# Patient Record
Sex: Male | Born: 1965 | Race: Black or African American | Hispanic: No | Marital: Married | Smoking: Never smoker
Health system: Southern US, Community
[De-identification: ages and names within clinical notes are randomized; demographics above are authoritative.]

## PROBLEM LIST (undated history)

## (undated) ENCOUNTER — Emergency Department (HOSPITAL_COMMUNITY): Admission: EM | Payer: No Typology Code available for payment source

## (undated) DIAGNOSIS — Z5900 Homelessness unspecified: Secondary | ICD-10-CM

## (undated) DIAGNOSIS — F1020 Alcohol dependence, uncomplicated: Secondary | ICD-10-CM

## (undated) DIAGNOSIS — G8929 Other chronic pain: Secondary | ICD-10-CM

---

## 1999-12-15 ENCOUNTER — Emergency Department (HOSPITAL_COMMUNITY): Admission: EM | Admit: 1999-12-15 | Discharge: 1999-12-15 | Payer: Self-pay | Admitting: Emergency Medicine

## 2019-07-12 ENCOUNTER — Emergency Department (HOSPITAL_COMMUNITY): Payer: Self-pay

## 2019-07-12 ENCOUNTER — Emergency Department (HOSPITAL_COMMUNITY)
Admission: EM | Admit: 2019-07-12 | Discharge: 2019-07-12 | Disposition: A | Discharge status: Home / Self Care | Payer: Self-pay | Attending: Emergency Medicine | Admitting: Emergency Medicine

## 2019-07-12 ENCOUNTER — Encounter (HOSPITAL_COMMUNITY): Payer: Self-pay

## 2019-07-12 DIAGNOSIS — S0101XA Laceration without foreign body of scalp, initial encounter: Secondary | ICD-10-CM | POA: Insufficient documentation

## 2019-07-12 DIAGNOSIS — Y939 Activity, unspecified: Secondary | ICD-10-CM | POA: Insufficient documentation

## 2019-07-12 DIAGNOSIS — S0990XA Unspecified injury of head, initial encounter: Secondary | ICD-10-CM

## 2019-07-12 DIAGNOSIS — Y999 Unspecified external cause status: Secondary | ICD-10-CM | POA: Insufficient documentation

## 2019-07-12 DIAGNOSIS — Y929 Unspecified place or not applicable: Secondary | ICD-10-CM | POA: Insufficient documentation

## 2019-07-12 DIAGNOSIS — S060X0A Concussion without loss of consciousness, initial encounter: Secondary | ICD-10-CM | POA: Insufficient documentation

## 2019-07-12 LAB — PROTIME-INR
INR: 1 (ref 0.8–1.2)
Prothrombin Time: 12.9 seconds (ref 11.4–15.2)

## 2019-07-12 LAB — I-STAT CHEM 8, ED
BUN: 8 mg/dL (ref 6–20)
Calcium, Ion: 1.12 mmol/L — ABNORMAL LOW (ref 1.15–1.40)
Chloride: 96 mmol/L — ABNORMAL LOW (ref 98–111)
Creatinine, Ser: 1.1 mg/dL (ref 0.61–1.24)
Glucose, Bld: 103 mg/dL — ABNORMAL HIGH (ref 70–99)
HCT: 31 % — ABNORMAL LOW (ref 39.0–52.0)
Hemoglobin: 10.5 g/dL — ABNORMAL LOW (ref 13.0–17.0)
Potassium: 3.5 mmol/L (ref 3.5–5.1)
Sodium: 133 mmol/L — ABNORMAL LOW (ref 135–145)
TCO2: 25 mmol/L (ref 22–32)

## 2019-07-12 LAB — COMPREHENSIVE METABOLIC PANEL
ALT: 72 U/L — ABNORMAL HIGH (ref 0–44)
AST: 121 U/L — ABNORMAL HIGH (ref 15–41)
Albumin: 3.6 g/dL (ref 3.5–5.0)
Alkaline Phosphatase: 59 U/L (ref 38–126)
Anion gap: 12 (ref 5–15)
BUN: 7 mg/dL (ref 6–20)
CO2: 20 mmol/L — ABNORMAL LOW (ref 22–32)
Calcium: 8.3 mg/dL — ABNORMAL LOW (ref 8.9–10.3)
Chloride: 100 mmol/L (ref 98–111)
Creatinine, Ser: 0.85 mg/dL (ref 0.61–1.24)
GFR calc Af Amer: >60 mL/min (ref >60)
GFR calc non Af Amer: >60 mL/min (ref >60)
Glucose, Bld: 105 mg/dL — ABNORMAL HIGH (ref 70–99)
Potassium: 3.1 mmol/L — ABNORMAL LOW (ref 3.5–5.1)
Sodium: 132 mmol/L — ABNORMAL LOW (ref 135–145)
Total Bilirubin: 0.6 mg/dL (ref 0.3–1.2)
Total Protein: 6.5 g/dL (ref 6.5–8.1)

## 2019-07-12 LAB — ETHANOL: Alcohol, Ethyl (B): 284 mg/dL — ABNORMAL HIGH (ref <10)

## 2019-07-12 LAB — URINALYSIS, ROUTINE W REFLEX MICROSCOPIC
Bacteria, UA: NONE SEEN
Bilirubin Urine: NEGATIVE
Glucose, UA: NEGATIVE mg/dL
Ketones, ur: NEGATIVE mg/dL
Leukocytes,Ua: NEGATIVE
Nitrite: NEGATIVE
Protein, ur: NEGATIVE mg/dL
Specific Gravity, Urine: 1.002 — ABNORMAL LOW (ref 1.005–1.030)
pH: 6 (ref 5.0–8.0)

## 2019-07-12 LAB — CBC
HCT: 29.2 % — ABNORMAL LOW (ref 39.0–52.0)
Hemoglobin: 9.9 g/dL — ABNORMAL LOW (ref 13.0–17.0)
MCH: 35.5 pg — ABNORMAL HIGH (ref 26.0–34.0)
MCHC: 33.9 g/dL (ref 30.0–36.0)
MCV: 104.7 fL — ABNORMAL HIGH (ref 80.0–100.0)
Platelets: 125 10*3/uL — ABNORMAL LOW (ref 150–400)
RBC: 2.79 MIL/uL — ABNORMAL LOW (ref 4.22–5.81)
RDW: 12.4 % (ref 11.5–15.5)
WBC: 4.7 10*3/uL (ref 4.0–10.5)
nRBC: 0 % (ref 0.0–0.2)

## 2019-07-12 LAB — SAMPLE TO BLOOD BANK

## 2019-07-12 MED ORDER — KETAMINE HCL 50 MG/5ML IJ SOSY
PREFILLED_SYRINGE | INTRAMUSCULAR | Status: AC
  Filled 2019-07-12: qty 10

## 2019-07-12 MED ORDER — LACTATED RINGERS IV BOLUS
1000.0000 mL | Freq: Once | INTRAVENOUS | Status: AC
  Administered 2019-07-12: 1000 mL via INTRAVENOUS

## 2019-07-12 NOTE — ED Notes (Signed)
Patient transported to CT 
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
## 2019-07-12 NOTE — ED Notes (Signed)
Pt voided unknown amount on bed
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
## 2019-07-29 ENCOUNTER — Emergency Department (HOSPITAL_COMMUNITY)
Admission: EM | Admit: 2019-07-29 | Discharge: 2019-07-29 | Disposition: A | Discharge status: Home / Self Care | Payer: Self-pay | Attending: Emergency Medicine | Admitting: Emergency Medicine

## 2019-07-29 DIAGNOSIS — Z4802 Encounter for removal of sutures: Secondary | ICD-10-CM | POA: Insufficient documentation

## 2019-07-29 MED ORDER — BACITRACIN-NEOMYCIN-POLYMYXIN 400-5-5000 EX OINT
TOPICAL_OINTMENT | Freq: Every day | CUTANEOUS | Status: DC
  Filled 2019-07-29: qty 1

## 2019-07-29 MED ORDER — LIDOCAINE-EPINEPHRINE-TETRACAINE (LET) TOPICAL GEL
3.0000 mL | Freq: Once | TOPICAL | Status: AC
  Administered 2019-07-29: 3 mL via TOPICAL
  Filled 2019-07-29: qty 3

## 2019-07-29 NOTE — ED Notes (Signed)
All appropriate discharge materials reviewed at length with patient. Time for questions provided. Pt has no other questions at this time and verbalizes understanding of all provided materials.  

## 2019-07-29 NOTE — ED Notes (Signed)
Neosporin and bandages placed on pts suture sites.
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
## 2019-07-29 NOTE — Discharge Instructions (Signed)
Put Neosporin over your wounds.

## 2019-07-29 NOTE — ED Triage Notes (Signed)
Pt here to have staples removed

## 2020-09-29 ENCOUNTER — Other Ambulatory Visit: Payer: Self-pay

## 2020-09-29 ENCOUNTER — Observation Stay (HOSPITAL_COMMUNITY)
Admission: EM | Admit: 2020-09-29 | Discharge: 2020-09-30 | Disposition: A | Discharge status: Home / Self Care | Payer: Self-pay | Attending: Emergency Medicine | Admitting: Emergency Medicine

## 2020-09-29 ENCOUNTER — Observation Stay (HOSPITAL_COMMUNITY): Payer: Self-pay

## 2020-09-29 ENCOUNTER — Emergency Department (HOSPITAL_COMMUNITY): Payer: Self-pay

## 2020-09-29 DIAGNOSIS — D649 Anemia, unspecified: Secondary | ICD-10-CM | POA: Insufficient documentation

## 2020-09-29 DIAGNOSIS — Z20822 Contact with and (suspected) exposure to covid-19: Secondary | ICD-10-CM | POA: Insufficient documentation

## 2020-09-29 DIAGNOSIS — E871 Hypo-osmolality and hyponatremia: Secondary | ICD-10-CM | POA: Insufficient documentation

## 2020-09-29 DIAGNOSIS — R188 Other ascites: Secondary | ICD-10-CM

## 2020-09-29 DIAGNOSIS — K863 Pseudocyst of pancreas: Secondary | ICD-10-CM | POA: Insufficient documentation

## 2020-09-29 DIAGNOSIS — K859 Acute pancreatitis without necrosis or infection, unspecified: Principal | ICD-10-CM | POA: Insufficient documentation

## 2020-09-29 LAB — CBC
HCT: 36.5 % — ABNORMAL LOW (ref 39.0–52.0)
Hemoglobin: 12.5 g/dL — ABNORMAL LOW (ref 13.0–17.0)
MCH: 35.9 pg — ABNORMAL HIGH (ref 26.0–34.0)
MCHC: 34.2 g/dL (ref 30.0–36.0)
MCV: 104.9 fL — ABNORMAL HIGH (ref 80.0–100.0)
Platelets: 141 10*3/uL — ABNORMAL LOW (ref 150–400)
RBC: 3.48 MIL/uL — ABNORMAL LOW (ref 4.22–5.81)
RDW: 12.4 % (ref 11.5–15.5)
WBC: 6.4 10*3/uL (ref 4.0–10.5)
nRBC: 0 % (ref 0.0–0.2)

## 2020-09-29 LAB — URINALYSIS, ROUTINE W REFLEX MICROSCOPIC
Bacteria, UA: NONE SEEN
Bilirubin Urine: NEGATIVE
Glucose, UA: NEGATIVE mg/dL
Hgb urine dipstick: NEGATIVE
Ketones, ur: NEGATIVE mg/dL
Nitrite: NEGATIVE
Protein, ur: 30 mg/dL — AB
Specific Gravity, Urine: 1.023 (ref 1.005–1.030)
pH: 5 (ref 5.0–8.0)

## 2020-09-29 LAB — BASIC METABOLIC PANEL
Anion gap: 9 (ref 5–15)
BUN: <5 mg/dL — ABNORMAL LOW (ref 6–20)
CO2: 24 mmol/L (ref 22–32)
Calcium: 9.1 mg/dL (ref 8.9–10.3)
Chloride: 99 mmol/L (ref 98–111)
Creatinine, Ser: 0.72 mg/dL (ref 0.61–1.24)
GFR, Estimated: >60 mL/min (ref >60)
Glucose, Bld: 95 mg/dL (ref 70–99)
Potassium: 4 mmol/L (ref 3.5–5.1)
Sodium: 132 mmol/L — ABNORMAL LOW (ref 135–145)

## 2020-09-29 LAB — COMPREHENSIVE METABOLIC PANEL
ALT: 36 U/L (ref 0–44)
AST: 36 U/L (ref 15–41)
Albumin: 3.6 g/dL (ref 3.5–5.0)
Alkaline Phosphatase: 78 U/L (ref 38–126)
Anion gap: 10 (ref 5–15)
BUN: <5 mg/dL — ABNORMAL LOW (ref 6–20)
CO2: 25 mmol/L (ref 22–32)
Calcium: 9.5 mg/dL (ref 8.9–10.3)
Chloride: 93 mmol/L — ABNORMAL LOW (ref 98–111)
Creatinine, Ser: 0.72 mg/dL (ref 0.61–1.24)
GFR, Estimated: >60 mL/min (ref >60)
Glucose, Bld: 94 mg/dL (ref 70–99)
Potassium: 3.9 mmol/L (ref 3.5–5.1)
Sodium: 128 mmol/L — ABNORMAL LOW (ref 135–145)
Total Bilirubin: 1 mg/dL (ref 0.3–1.2)
Total Protein: 7.4 g/dL (ref 6.5–8.1)

## 2020-09-29 LAB — LIPASE, BLOOD: Lipase: 315 U/L — ABNORMAL HIGH (ref 11–51)

## 2020-09-29 LAB — OSMOLALITY, URINE: Osmolality, Ur: 388 mOsm/kg (ref 300–900)

## 2020-09-29 LAB — RESP PANEL BY RT-PCR (FLU A&B, COVID) ARPGX2
Influenza A by PCR: NEGATIVE
Influenza B by PCR: NEGATIVE
SARS Coronavirus 2 by RT PCR: NEGATIVE

## 2020-09-29 LAB — SODIUM, URINE, RANDOM: Sodium, Ur: 33 mmol/L

## 2020-09-29 MED ORDER — RIVAROXABAN 10 MG PO TABS
10.0000 mg | ORAL_TABLET | Freq: Every day | ORAL | Status: DC
  Filled 2020-09-29 (×2): qty 1

## 2020-09-29 MED ORDER — SODIUM CHLORIDE 0.9 % IV SOLN: INTRAVENOUS | Status: DC

## 2020-09-29 MED ORDER — SODIUM CHLORIDE 0.9 % IV BOLUS
1000.0000 mL | Freq: Once | INTRAVENOUS | Status: AC
  Administered 2020-09-29: 1000 mL via INTRAVENOUS

## 2020-09-29 MED ORDER — SODIUM CHLORIDE 0.9 % IV SOLN: INTRAVENOUS | Status: AC

## 2020-09-29 MED ORDER — NICOTINE 7 MG/24HR TD PT24
7.0000 mg | MEDICATED_PATCH | Freq: Every day | TRANSDERMAL | Status: DC
  Filled 2020-09-29 (×2): qty 1

## 2020-09-29 MED ORDER — ONDANSETRON HCL 4 MG/2ML IJ SOLN
4.0000 mg | Freq: Three times a day (TID) | INTRAMUSCULAR | Status: DC | PRN
  Administered 2020-09-29: 4 mg via INTRAVENOUS
  Filled 2020-09-29: qty 2

## 2020-09-29 MED ORDER — THIAMINE HCL 100 MG PO TABS
100.0000 mg | ORAL_TABLET | Freq: Every day | ORAL | Status: DC
  Filled 2020-09-29: qty 1

## 2020-09-29 MED ORDER — ONDANSETRON HCL 4 MG/2ML IJ SOLN
4.0000 mg | Freq: Once | INTRAMUSCULAR | Status: AC
  Administered 2020-09-29: 4 mg via INTRAVENOUS
  Filled 2020-09-29: qty 2

## 2020-09-29 MED ORDER — HYDROMORPHONE HCL 1 MG/ML IJ SOLN
0.5000 mg | INTRAMUSCULAR | Status: DC | PRN
Start: 2020-09-29 — End: 2020-09-30
  Administered 2020-09-29: 0.5 mg via INTRAVENOUS
  Filled 2020-09-29: qty 1

## 2020-09-29 MED ORDER — IOHEXOL 350 MG/ML SOLN
80.0000 mL | Freq: Once | INTRAVENOUS | Status: AC | PRN
  Administered 2020-09-29: 80 mL via INTRAVENOUS

## 2020-09-29 MED ORDER — HYDROMORPHONE HCL 1 MG/ML IJ SOLN
0.5000 mg | Freq: Once | INTRAMUSCULAR | Status: AC
  Administered 2020-09-29: 0.5 mg via INTRAVENOUS
  Filled 2020-09-29: qty 1

## 2020-09-29 MED ORDER — SODIUM CHLORIDE 0.9% FLUSH: 3.0000 mL | Freq: Two times a day (BID) | INTRAVENOUS | Status: DC

## 2020-09-29 NOTE — Progress Notes (Signed)
Patient ate lunch at 1300 and aware to be NPO for 4 hour for U/S abdomen. Her verbalized not to eat or drink anything until test completed.

## 2020-09-29 NOTE — ED Provider Notes (Signed)
[facility] EMERGENCY DEPARTMENT Provider Note   CSN: [id number] Arrival date & time: 09/29/20  8469     History Chief Complaint  Patient presents with   Abdominal Pain    Nathaniel Hickman is a 55 y.o. male.  Patient with no past surgical history presents the emergency department for evaluation of sharp, upper abdominal pain with radiation to the back over the past 2 to 3 weeks.  Pain has been occurring every day.  He has associated nausea and occasional vomiting.  No bowel or urine changes.  No chest pain or shortness of breath.  He has never had pain like this in the past.  He states that he has never been in the hospital before.  He drinks a 40 ounce beer "every other day".  He also has been taking ibuprofen, Aleve, Goody powder, Maalox for his symptoms without improvement.  Transported by EMS early this morning.      No past medical history on file.  There are no problems to display for this patient.   No past surgical history on file.     No family history on file.     Home Medications Prior to Admission medications   Not on File    Allergies    Patient has no known allergies.  Review of Systems   Review of Systems  Constitutional:  Negative for fever.  HENT:  Negative for rhinorrhea and sore throat.   Eyes:  Negative for redness.  Respiratory:  Negative for cough.   Cardiovascular:  Negative for chest pain.  Gastrointestinal:  Positive for abdominal pain, nausea and vomiting. Negative for diarrhea.  Genitourinary:  Negative for dysuria and hematuria.  Musculoskeletal:  Positive for back pain. Negative for myalgias.  Skin:  Negative for rash.  Neurological:  Negative for headaches.   Physical Exam Updated Vital Signs BP (!) 120/97 (BP Location: Left Arm)   Pulse 79   Temp 97.7 F (36.5 C) (Oral)   Resp 20   Ht 5\' 5"  (1.651 m)   Wt 60 kg   SpO2 100%   BMI 22.01 kg/m   Physical Exam Vitals and nursing note reviewed.   Constitutional:      General: He is in acute distress.     Appearance: He is well-developed.     Comments: Patient appears uncomfortable, in pain  HENT:     Head: Normocephalic and atraumatic.  Eyes:     General:        Right eye: No discharge.        Left eye: No discharge.     Conjunctiva/sclera: Conjunctivae normal.  Cardiovascular:     Rate and Rhythm: Normal rate and regular rhythm.     Heart sounds: Normal heart sounds.  Pulmonary:     Effort: Pulmonary effort is normal.     Breath sounds: Normal breath sounds.  Abdominal:     Palpations: Abdomen is soft.     Tenderness: There is generalized abdominal tenderness (Moderate generalized abdominal pain without rebound or guarding, worse in the upper quadrants).  Musculoskeletal:     Cervical back: Normal range of motion and neck supple.  Skin:    General: Skin is warm and dry.  Neurological:     Mental Status: He is alert.    ED Results / Procedures / Treatments   Labs (all labs ordered are listed, but only abnormal results are displayed) Labs Reviewed  LIPASE, BLOOD - Abnormal; Notable for the following components:  Result Value   Lipase 315 (*)    All other components within normal limits  COMPREHENSIVE METABOLIC PANEL - Abnormal; Notable for the following components:   Sodium 128 (*)    Chloride 93 (*)    BUN <5 (*)    All other components within normal limits  CBC - Abnormal; Notable for the following components:   RBC 3.48 (*)    Hemoglobin 12.5 (*)    HCT 36.5 (*)    MCV 104.9 (*)    MCH 35.9 (*)    Platelets 141 (*)    All other components within normal limits  URINALYSIS, ROUTINE W REFLEX MICROSCOPIC - Abnormal; Notable for the following components:   Color, Urine AMBER (*)    APPearance HAZY (*)    Protein, ur 30 (*)    Leukocytes,Ua TRACE (*)    All other components within normal limits  RESP PANEL BY RT-PCR (FLU A&B, COVID) ARPGX2    EKG None  Radiology CT ABDOMEN PELVIS W  CONTRAST  Result Date: 09/29/2020 CLINICAL DATA:  Abdominal pain, question pancreatitis EXAM: CT ABDOMEN AND PELVIS WITH CONTRAST TECHNIQUE: Multidetector CT imaging of the abdomen and pelvis was performed using the standard protocol following bolus administration of intravenous contrast. CONTRAST:  58mL OMNIPAQUE IOHEXOL 350 MG/ML SOLN COMPARISON:  None. FINDINGS: Lower chest: There is dependent subsegmental atelectasis in the lung bases. The imaged heart is unremarkable. Hepatobiliary: The liver is diffusely hypoattenuating consistent with fatty infiltration. There are no focal lesions. The gallbladder is unremarkable. There is no biliary ductal dilatation. Pancreas: There is stranding in the peripancreatic fat predominantly around the body/tail. There is no main pancreatic ductal dilatation. Spleen: There is a 6 mm enhancing lesion in the superior portion of the spleen. The spleen is otherwise unremarkable. Adrenals/Urinary Tract: The adrenals are unremarkable. A subcentimeter lesion in the left kidney lower pole is too small to characterize but likely reflects a cyst. The kidneys are otherwise normal, with no other focal lesion, stone, hydronephrosis, or hydroureter. The bladder is unremarkable. Stomach/Bowel: There is a 5.3 cm AP by 2.0 cm TV by 3.4 cm cc peripherally enhancing collection along the greater curvature of the stomach. The stomach wall appears somewhat thickened, though this is likely due to underdistention. There is no evidence of bowel obstruction. There is mild fluid distention of multiple loops of small and large bowel. The appendix is normal. Vascular/Lymphatic: The abdominal aorta is nonaneurysmal. The main portal and splenic veins are patent. There is no abdominal or pelvic lymphadenopathy. Reproductive: The prostate and seminal vesicles are unremarkable. Other: There is scattered free fluid throughout the abdomen and pelvis with mild inflammatory stranding in the mesenteric fat.  Musculoskeletal: There is a benign-appearing sclerotic lesion in the left iliac bone. There is no acute osseous abnormality or aggressive osseous lesion. IMPRESSION: 1. Findings above consistent with acute interstitial pancreatitis. 2. 5.3 cm x 2.0 cm x 3.4 cm fluid collection centered along the greater curvature of the stomach likely reflects an acute peripancreatic fluid collection/developing pseudocyst. Infection cannot be excluded by imaging. 3. Scattered ascites and inflammatory fat stranding throughout the remainder of the abdomen/pelvis. 4. Hepatic steatosis. 5. 6 mm enhancing lesion in the spleen is indeterminate but likely benign. Attention on follow-up studies. Electronically Signed   By: Lesia Hausen M.D.   On: 09/29/2020 08:16    Procedures Procedures   Medications Ordered in ED Medications  sodium chloride 0.9 % bolus 1,000 mL (has no administration in time range)  HYDROmorphone (DILAUDID)  injection 0.5 mg (has no administration in time range)  ondansetron (ZOFRAN) injection 4 mg (has no administration in time range)    ED Course  I have reviewed the triage vital signs and the nursing notes.  Pertinent labs & imaging results that were available during my care of the patient were reviewed by me and considered in my medical decision making (see chart for details).  Patient seen and examined. Work-up initiated. Medications ordered.   Vital signs reviewed and are as follows: BP (!) 120/97 (BP Location: Left Arm)   Pulse 79   Temp 97.7 F (36.5 C) (Oral)   Resp 20   Ht 5\' 5"  (1.651 m)   Wt 60 kg   SpO2 100%   BMI 22.01 kg/m   Will need CT imaging.  Anticipate admission to the hospital for symptom control.  Anticipate that his current episode of pancreatitis is due to chronic alcohol use.  MCV noted to be elevated.  8:31 AM CT consistent with pancreatitis with pseudocyst formation.  Patient agrees to admission.  On recheck, pain is currently controlled.  Will admit for  unassigned medicine.  8:37 AM Spoke with IMTS who will see.     MDM Rules/Calculators/A&P                           Admit.   Final Clinical Impression(s) / ED Diagnoses Final diagnoses:  Acute pancreatitis, unspecified complication status, unspecified pancreatitis type  Pancreatic pseudocyst    Rx / DC Orders ED Discharge Orders     None        , PA-C 09/29/20 11/29/20    0938, MD 10/01/20 [phone]

## 2020-09-29 NOTE — Progress Notes (Addendum)
Patient returned from abd U/S, clear liquid diet tray ordered. Patient denied any distress at this time. Pt request for Nicotine patch. MD paged. Report given to oncoming RN

## 2020-09-29 NOTE — H&P (Signed)
Date: 09/29/2020               Patient Name:  Nathaniel Hickman MRN: [id number]  DOB: May 28, 1965 Age / Sex: 55 y.o., male   PCP: Pcp, No         Medical Service: Internal Medicine Teaching Service         Attending Physician: Dr. Reymundo Poll, MD    First Contact: Nathaniel Carwin, MD Pager: [phone]  Second Contact: Nathaniel Haller, MD Pager: [phone]       After Hours (After 5p/  First Contact Pager: [phone]  weekends / holidays): Second Contact Pager: [phone]   SUBJECTIVE  Chief Complaint: abdominal pain  History of Present Illness: Nathaniel Hickman is a 55 y.o. male with PMHx of chronic back pain, who presents to [facility] with abdominal pain radiating to back and chest for three weeks duration.   Nathaniel Hickman reports three weeks of sharp epigastric abdominal pain radiating to his chest, upper abdomen, periumbilical region, back.  He reports associated nausea and intermittent episodes of dry heaving.  He denies any vomiting.  He has had associated decreased appetite. He reports trying Maalox, Goody powder, BC powder and Aleve which would only provide relief for 30 minutes before the return of his abdominal pain. He denies any fevers/chills, headache, dizziness/lightheadedness, shortness of breath or changes in bowel habits or color.    In the ED, the patient was afebrile, normotensive, saturating well on room air. CBC without leukocytosis but does have macrocytic anemia that appears to be chronic. CMP significant for hyponatremia of 128 without any renal dysfunction or LFT elevation. Lipase 315. CT Abdomen/Pelvis w contrast consistent with acute pancreatitis. Patient given one dose of IV dilaudid and Zofran and started on IV fluid resuscitation. Patient admitted for further management.   Medications: No current facility-administered medications on file prior to encounter.   Current Outpatient Medications on File Prior to Encounter  Medication Sig Dispense Refill    aluminum-magnesium hydroxide 200-200 MG/5ML suspension Take 10 mLs by mouth every 6 (six) hours as needed for indigestion.     ibuprofen (ADVIL) 200 MG tablet Take 400 mg by mouth every 6 (six) hours as needed for headache or moderate pain.      Past Medical History:  No past medical history on file.  Social:  Patient lives at home by himself.  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  He denies any tobacco use.  He reports drinking a six-pack of beer in 2 days.  He did reports a history of marijuana use but quit 3 weeks prior.  He denies any other illicit substance use. He does not currently have a PCP.  He does report recent stress due to financial hardships.  Family History: Denies any known family history of heart disease, thyroid problems, cancers.  Allergies: Allergies as of 09/29/2020   (No Known Allergies)    Review of Systems: A complete ROS was negative except as per HPI.   OBJECTIVE:  Physical Exam: Blood pressure 99/72, pulse (!) 58, temperature 97.7 F (36.5 C), temperature source Oral, resp. rate 17, height 5\' 5"  (1.651 m), weight 60 kg, SpO2 98 %. Physical Exam  Constitutional: Thin, middle aged male. No acute distress.  HENT: Normocephalic and atraumatic, EOMI, moist mucous membranes Cardiovascular: Normal rate, regular rhythm, S1 and S2 present, no murmurs, rubs, gallops.  Distal pulses intact Respiratory: No respiratory distress, Effort is normal on room air.  Lungs are clear to  auscultation bilaterally. GI: Nondistended, soft, diffuse tenderness to palpation that is worse in epigastric region and upper quadrants, no rebound tenderness or guarding; normal bowel sounds Musculoskeletal: Normal bulk and tone.  No peripheral edema noted. Neurological: Is alert and oriented x4, no apparent focal deficits noted. Skin: Warm and dry.  No rash, erythema, lesions noted. Psychiatric: Normal mood and affect. Behavior is normal. Judgment and thought content normal.    Pertinent Labs: CBC    Component Value Date/Time   WBC 6.4 09/29/2020 0336   RBC 3.48 (L) 09/29/2020 0336   HGB 12.5 (L) 09/29/2020 0336   HCT 36.5 (L) 09/29/2020 0336   PLT 141 (L) 09/29/2020 0336   MCV 104.9 (H) 09/29/2020 0336   MCH 35.9 (H) 09/29/2020 0336   MCHC 34.2 09/29/2020 0336   RDW 12.4 09/29/2020 0336     CMP     Component Value Date/Time   NA 128 (L) 09/29/2020 0336   K 3.9 09/29/2020 0336   CL 93 (L) 09/29/2020 0336   CO2 25 09/29/2020 0336   GLUCOSE 94 09/29/2020 0336   BUN <5 (L) 09/29/2020 0336   CREATININE 0.72 09/29/2020 0336   CALCIUM 9.5 09/29/2020 0336   PROT 7.4 09/29/2020 0336   ALBUMIN 3.6 09/29/2020 0336   AST 36 09/29/2020 0336   ALT 36 09/29/2020 0336   ALKPHOS 78 09/29/2020 0336   BILITOT 1.0 09/29/2020 0336   GFRNONAA >60 09/29/2020 0336   GFRAA >60 07/12/2019 2057    Pertinent Imaging: CT ABDOMEN PELVIS W CONTRAST  Result Date: 09/29/2020 CLINICAL DATA:  Abdominal pain, question pancreatitis EXAM: CT ABDOMEN AND PELVIS WITH CONTRAST TECHNIQUE: Multidetector CT imaging of the abdomen and pelvis was performed using the standard protocol following bolus administration of intravenous contrast. CONTRAST:  67mL OMNIPAQUE IOHEXOL 350 MG/ML SOLN COMPARISON:  None. FINDINGS: Lower chest: There is dependent subsegmental atelectasis in the lung bases. The imaged heart is unremarkable. Hepatobiliary: The liver is diffusely hypoattenuating consistent with fatty infiltration. There are no focal lesions. The gallbladder is unremarkable. There is no biliary ductal dilatation. Pancreas: There is stranding in the peripancreatic fat predominantly around the body/tail. There is no main pancreatic ductal dilatation. Spleen: There is a 6 mm enhancing lesion in the superior portion of the spleen. The spleen is otherwise unremarkable. Adrenals/Urinary Tract: The adrenals are unremarkable. A subcentimeter lesion in the left kidney lower pole is too small to  characterize but likely reflects a cyst. The kidneys are otherwise normal, with no other focal lesion, stone, hydronephrosis, or hydroureter. The bladder is unremarkable. Stomach/Bowel: There is a 5.3 cm AP by 2.0 cm TV by 3.4 cm cc peripherally enhancing collection along the greater curvature of the stomach. The stomach wall appears somewhat thickened, though this is likely due to underdistention. There is no evidence of bowel obstruction. There is mild fluid distention of multiple loops of small and large bowel. The appendix is normal. Vascular/Lymphatic: The abdominal aorta is nonaneurysmal. The main portal and splenic veins are patent. There is no abdominal or pelvic lymphadenopathy. Reproductive: The prostate and seminal vesicles are unremarkable. Other: There is scattered free fluid throughout the abdomen and pelvis with mild inflammatory stranding in the mesenteric fat. Musculoskeletal: There is a benign-appearing sclerotic lesion in the left iliac bone. There is no acute osseous abnormality or aggressive osseous lesion. IMPRESSION: 1. Findings above consistent with acute interstitial pancreatitis. 2. 5.3 cm x 2.0 cm x 3.4 cm fluid collection centered along the greater curvature of the stomach likely reflects  an acute peripancreatic fluid collection/developing pseudocyst. Infection cannot be excluded by imaging. 3. Scattered ascites and inflammatory fat stranding throughout the remainder of the abdomen/pelvis. 4. Hepatic steatosis. 5. 6 mm enhancing lesion in the spleen is indeterminate but likely benign. Attention on follow-up studies. Electronically Signed   By: Lesia Hausen M.D.   On: 09/29/2020 08:16    EKG: personally reviewed my interpretation is normal EKG, normal sinus rhythm, there are no previous tracings available for comparison  ASSESSMENT & PLAN:  Assessment: Active Problems:   Acute pancreatitis   Nathaniel Hickman is a 55 y.o. with pertinent PMH of chronic back pain who presented with  abdominal pain and admit for acute pancreatitis on hospital day 0  Plan: #Acute pancreatitis  Patient presented with epigastric abdominal pain radiating to chest, back and diffusely across abdomen. Abdomen is diffusely tender on exam without rebound or guarding. No signs of peritonitis. Lipase elevated to 315. CT Abdomen/Pelvis with acute interstitial pancreatitis. Patient started on pain control and antiemetic. Etiology of patient's pancreatitis is likely secondary to alcohol use vs NSAID use. He does endorse daily fast food intake. Calcium wnl. No significant LFT elevations, less likely to be in setting of gallstone pancreatitis.  Peripancreatic fluid collection noted on imaging - 5x2x3cm - concerns for developing pseudocyst. Although he does have abdominal pain and nausea, concerning for possible mass effect, these symptoms are likely secondary to acute pancreatitis. Discussed possible aspiration with IR; however, does not have good windows for access. If pseudocyst persists and causes symptoms once he is over acute pancreatitis flare, can consider GI consult for drainage.  - IV LR 125cc/hr - IV dilaudid 0.5mg  q4h prn  - IV Zofran 4mg  q8h prn - Lipid panel - Clear liquid diet, advance as tolerated  - Encourage alcohol cessation  #Hypotonic hyponatremia Na 128 on presentation. Possibly in setting of decreased oral intake in setting of acute pancreatitis as above vs alcohol use. Currently asymptomatic.  - Urine studies - Fluid resuscitation as above  - Trend BMP   #Alcohol use  Patient reports daily alcohol use of three beers daily. He does not appear to be in acute withdrawal at this time.  - CIWA wo Ativan  - Thiamine 100mg  daily  - Encourage alcohol cessation  #Macrocytic anemia  This appears to be chronic. Likely in setting of alcohol use.  -Vitamin B12, folate - Trend CBC   Best Practice: Diet: Clear liquid diet IVF: Fluids: 0.9NS, Rate:  125 cc/hr x 24 hrs VTE: rivaroxaban  (XARELTO) tablet 10 mg Start: 09/29/20 1700 Code: Full AB: None Status: Observation with expected length of stay less than 2 midnights. Anticipated Discharge Location: Home Barriers to Discharge: Medical stability  Signature: , MD Internal Medicine Resident, PGY-3 11/29/20 Internal Medicine Residency  Pager: [phone] 9:54 AM, 09/29/2020   Please contact the on call pager after 5 pm and on weekends at [phone].

## 2020-09-29 NOTE — ED Triage Notes (Signed)
Pt brought in by EMS for sharp abd pain for the past 3 weeks. Pt also reports nausea. Pt states the pain radiates to his back.

## 2020-09-29 NOTE — Plan of Care (Signed)
  Problem: Education: Goal: Knowledge of General Education information will improve Description Including pain rating scale, medication(s)/side effects and non-pharmacologic comfort measures Outcome: Progressing   

## 2020-09-30 LAB — LIPID PANEL
Cholesterol: 158 mg/dL (ref 0–200)
HDL: 86 mg/dL (ref >40)
LDL Cholesterol: 61 mg/dL (ref 0–99)
Total CHOL/HDL Ratio: 1.8 RATIO
Triglycerides: 55 mg/dL (ref <150)
VLDL: 11 mg/dL (ref 0–40)

## 2020-09-30 LAB — BASIC METABOLIC PANEL
Anion gap: 9 (ref 5–15)
BUN: <5 mg/dL — ABNORMAL LOW (ref 6–20)
CO2: 25 mmol/L (ref 22–32)
Calcium: 9.3 mg/dL (ref 8.9–10.3)
Chloride: 99 mmol/L (ref 98–111)
Creatinine, Ser: 0.62 mg/dL (ref 0.61–1.24)
GFR, Estimated: >60 mL/min (ref >60)
Glucose, Bld: 89 mg/dL (ref 70–99)
Potassium: 3.7 mmol/L (ref 3.5–5.1)
Sodium: 133 mmol/L — ABNORMAL LOW (ref 135–145)

## 2020-09-30 LAB — CBC
HCT: 33.7 % — ABNORMAL LOW (ref 39.0–52.0)
Hemoglobin: 11.8 g/dL — ABNORMAL LOW (ref 13.0–17.0)
MCH: 36.3 pg — ABNORMAL HIGH (ref 26.0–34.0)
MCHC: 35 g/dL (ref 30.0–36.0)
MCV: 103.7 fL — ABNORMAL HIGH (ref 80.0–100.0)
Platelets: 126 10*3/uL — ABNORMAL LOW (ref 150–400)
RBC: 3.25 MIL/uL — ABNORMAL LOW (ref 4.22–5.81)
RDW: 12.5 % (ref 11.5–15.5)
WBC: 3.9 10*3/uL — ABNORMAL LOW (ref 4.0–10.5)
nRBC: 0 % (ref 0.0–0.2)

## 2020-09-30 LAB — IRON AND TIBC
Iron: 52 ug/dL (ref 45–182)
Saturation Ratios: 17 % — ABNORMAL LOW (ref 17.9–39.5)
TIBC: 298 ug/dL (ref 250–450)
UIBC: 246 ug/dL

## 2020-09-30 LAB — HIV ANTIBODY (ROUTINE TESTING W REFLEX): HIV Screen 4th Generation wRfx: NONREACTIVE

## 2020-09-30 LAB — FERRITIN: Ferritin: 152 ng/mL (ref 24–336)

## 2020-09-30 LAB — FOLATE: Folate: 13 ng/mL (ref >5.9)

## 2020-09-30 LAB — VITAMIN B12: Vitamin B-12: 369 pg/mL (ref 180–914)

## 2020-09-30 MED ORDER — VITAMIN B-12 1000 MCG PO TABS
500.0000 ug | ORAL_TABLET | Freq: Every day | ORAL | Status: DC
  Filled 2020-09-30: qty 1

## 2020-09-30 NOTE — Discharge Summary (Signed)
Name: Nathaniel Hickman MRN: [id number] DOB: 1965-11-02 55 y.o. PCP: Pcp, No  Date of Admission: 09/29/2020  3:18 AM Date of Discharge:  03/30/2020 Attending Physician: Dr. Oswaldo Done  DISCHARGE DIAGNOSIS:  Primary Problem: Acute pancreatitis   Hospital Problems: Active Problems:   Acute pancreatitis   DISCHARGE MEDICATIONS:   Allergies as of 09/30/2020   No Known Allergies      Medication List     TAKE these medications    aluminum-magnesium hydroxide 200-200 MG/5ML suspension Take 10 mLs by mouth every 6 (six) hours as needed for indigestion.   ibuprofen 200 MG tablet Commonly known as: ADVIL Take 400 mg by mouth every 6 (six) hours as needed for headache or moderate pain.        DISPOSITION AND FOLLOW-UP:  Mr.Nathaniel Hickman was discharged from [facility] in Stable condition. At the hospital follow up visit please address:  #Acute pancreatitis Patient had evidence of acute pancreatitis given radiating epigastric pain, elevated lipase to 315, and CT abdomen findings of a 5.3 cm x 2.0 cm x 3.4 cm fluid collection centered along the greater curvature of the stomach that likely reflects an acute peripancreatic fluid collection/developing pseudocyst, scattered ascites and inflammatory fat stranding throughout the remainder of the abdomen/pelvis, and hepatic steatosis. Abdominal ultrasound showed cholelithiasis with trace fluid adjacent to gallbladder but no wall thickening or sonographic Murphy. Concern that etiology is of gallstone nature; general surgery was consulted and will see him as an outpatient to discuss laparoscopic cholecystectomy. Patient counseled on dietary modifications to include decreased consumption of fatty foods and decreased consumption of alcohol.  #Hypotonic hyponatremia Likely secondary to poor PO intake.  #Alcohol use #Macrocytic anemia Patient with chronic alcohol use (~3 beers per day) with normal vitamin B12, folate, and  macrocytosis. Macrocytosis may be related to the chronic alcohol use. Counseled patient on reduction in alcohol consumption.   Follow-up Recommendations: Consults: Internal Medicine, General Surgery Labs: CBC and Comprehensive Metabolic Panel Studies: None Medications: None  Follow-up Appointments: Please follow-up with your primary care physician in 1 week.  HOSPITAL COURSE:  Patient Summary: [name] is a 55 year old male with pertinent PMH of chronic back pain who presented with abdominal pain and was admitted for acute pancreatitis on hospital day 1.  #Acute pancreatitis  Patient presented with epigastric abdominal pain radiating to chest, back, and diffusely across abdomen. Abdomen was diffusely tender on exam without rebound or guarding. No signs of peritonitis. Lipase elevated to 315. CT Abdomen/Pelvis showed acute interstitial pancreatitis. Patient started on pain control (IV dilaudid 0.5mg  q4h prn) and antiemetic (IV Zofran 4mg  q8h prn) PRN and received one dose of each. Etiology of patient's pancreatitis is likely secondary to alcohol use vs NSAID use. He drinks 3 beers per day on average, and alcohol cessation was encouraged. He also endorses daily fast food intake, and dietary changes encouraged. Lipid panel within normal limits. Calcium within normal limits. No significant LFT elevations, less likely to be in setting of gallstone pancreatitis.  Ultrasound showed cholelithiasis, an echogenic liver, and trace fluid next to the gallbladder. Peripancreatic fluid collection noted on CT - 5x2x3cm - concerns for developing pseudocyst. Although he does have abdominal pain and nausea, concerning for possible mass effect, these symptoms are likely secondary to acute pancreatitis. Discussed possible aspiration with IR; however, does not have good windows for access. If pseudocyst persists and causes symptoms once he is over acute pancreatitis flare, can consider GI consult for  drainage.   Diet advanced  to solid foods the morning of 9/3.   #Hypotonic hyponatremia Na 128 on presentation. Possibly in setting of decreased oral intake in setting of acute pancreatitis as above vs alcohol use. Currently asymptomatic. A continuous infusion of 0.9% sodium chloride was started 9/1. Na improved to 133 overnight.   #Alcohol use  Patient reports daily alcohol use and consumes three beers each day on average. He did not appear to be in acute withdrawal. CIWA score was 2 due to agitation only around 0500 on 9/3, and he has not needed Ativan. Given tablet of Thiamine 100mg . Alcohol cessation encouraged.   #Macrocytic anemia  This appears to be chronic. Likely in setting of alcohol use. Vitamin B12 and folate levels were normal. dose of Vitamin B12 administered 9/3.   DISCHARGE INSTRUCTIONS:   Discharge Instructions     Call MD for:  persistant nausea and vomiting   Complete by: As directed    Call MD for:  severe uncontrolled pain   Complete by: As directed    Increase activity slowly   Complete by: As directed        SUBJECTIVE:  [name] was seen and evaluated at bedside on day of discharge. He reports no pain at this time and is ready for an advanced diet. He feels ready to go home.  Discharge Vitals:   BP 119/84 (BP Location: Right Arm)   Pulse 80   Temp 98.6 F (37 C) (Oral)   Resp 16   Ht 5\' 5"  (1.651 m)   Wt 47.3 kg   SpO2 100%   BMI 17.36 kg/m   OBJECTIVE:  Physical Exam   Pertinent Labs, Studies, and Procedures:  CBC Latest Ref Rng & Units 09/30/2020 09/29/2020 07/12/2019  WBC 4.0 - 10.5 K/uL 3.9(L) 6.4 -  Hemoglobin 13.0 - 17.0 g/dL 11.8(L) 12.5(L) 10.5(L)  Hematocrit 39.0 - 52.0 % 33.7(L) 36.5(L) 31.0(L)  Platelets 150 - 400 K/uL 126(L) 141(L) -    CMP Latest Ref Rng & Units 09/30/2020 09/29/2020 09/29/2020  Glucose 70 - 99 mg/dL 89 95 94  BUN 6 - 20 mg/dL 11/29/2020) 11/29/2020) <7(P)  Creatinine 0.61 - 1.24 mg/dL <8(E <4(M 3.53  Sodium 135 - 145  mmol/L 133(L) 132(L) 128(L)  Potassium 3.5 - 5.1 mmol/L 3.7 4.0 3.9  Chloride 98 - 111 mmol/L 99 99 93(L)  CO2 22 - 32 mmol/L 25 24 25   Calcium 8.9 - 10.3 mg/dL 9.3 9.1 9.5  Total Protein 6.5 - 8.1 g/dL - - 7.4  Total Bilirubin 0.3 - 1.2 mg/dL - - 1.0  Alkaline Phos 38 - 126 U/L - - 78  AST 15 - 41 U/L - - 36  ALT 0 - 44 U/L - - 36    CT ABDOMEN PELVIS W CONTRAST  Result Date: 09/29/2020 CLINICAL DATA:  Abdominal pain, question pancreatitis EXAM: CT ABDOMEN AND PELVIS WITH CONTRAST TECHNIQUE: Multidetector CT imaging of the abdomen and pelvis was performed using the standard protocol following bolus administration of intravenous contrast. CONTRAST:  2mL OMNIPAQUE IOHEXOL 350 MG/ML SOLN COMPARISON:  None. FINDINGS: Lower chest: There is dependent subsegmental atelectasis in the lung bases. The imaged heart is unremarkable. Hepatobiliary: The liver is diffusely hypoattenuating consistent with fatty infiltration. There are no focal lesions. The gallbladder is unremarkable. There is no biliary ductal dilatation. Pancreas: There is stranding in the peripancreatic fat predominantly around the body/tail. There is no main pancreatic ductal dilatation. Spleen: There is a 6 mm enhancing lesion in the superior portion of the  spleen. The spleen is otherwise unremarkable. Adrenals/Urinary Tract: The adrenals are unremarkable. A subcentimeter lesion in the left kidney lower pole is too small to characterize but likely reflects a cyst. The kidneys are otherwise normal, with no other focal lesion, stone, hydronephrosis, or hydroureter. The bladder is unremarkable. Stomach/Bowel: There is a 5.3 cm AP by 2.0 cm TV by 3.4 cm cc peripherally enhancing collection along the greater curvature of the stomach. The stomach wall appears somewhat thickened, though this is likely due to underdistention. There is no evidence of bowel obstruction. There is mild fluid distention of multiple loops of small and large bowel. The  appendix is normal. Vascular/Lymphatic: The abdominal aorta is nonaneurysmal. The main portal and splenic veins are patent. There is no abdominal or pelvic lymphadenopathy. Reproductive: The prostate and seminal vesicles are unremarkable. Other: There is scattered free fluid throughout the abdomen and pelvis with mild inflammatory stranding in the mesenteric fat. Musculoskeletal: There is a benign-appearing sclerotic lesion in the left iliac bone. There is no acute osseous abnormality or aggressive osseous lesion. IMPRESSION: 1. Findings above consistent with acute interstitial pancreatitis. 2. 5.3 cm x 2.0 cm x 3.4 cm fluid collection centered along the greater curvature of the stomach likely reflects an acute peripancreatic fluid collection/developing pseudocyst. Infection cannot be excluded by imaging. 3. Scattered ascites and inflammatory fat stranding throughout the remainder of the abdomen/pelvis. 4. Hepatic steatosis. 5. 6 mm enhancing lesion in the spleen is indeterminate but likely benign. Attention on follow-up studies. Electronically Signed   By: Lesia Hausen M.D.   On: 09/29/2020 08:16   US Abdomen Limited RUQ (LIVER/GB)  Result Date: 09/29/2020 CLINICAL DATA:  Right upper quadrant pain EXAM: ULTRASOUND ABDOMEN LIMITED RIGHT UPPER QUADRANT COMPARISON:  CT 09/29/2020 FINDINGS: Gallbladder: Small gallstones. Normal wall thickness. Negative sonographic Murphy. Trace fluid adjacent to gallbladder. Common bile duct: Diameter: 3 mm Liver: Diffusely echogenic. No focal hepatic abnormality portal vein is patent on color Doppler imaging with normal direction of blood flow towards the liver. Other: None. IMPRESSION: 1. Cholelithiasis with trace fluid adjacent to gallbladder but no wall thickening or sonographic Murphy. If further imaging is required, evaluation with nuclear medicine hepatobiliary imaging could be obtained 2. Echogenic liver consistent with steatosis and or hepatocellular disease Electronically  Signed   By: Jasmine Pang M.D.   On: 09/29/2020 19:09     Signed: Champ Mungo, D.O.  Internal Medicine Resident, PGY-1 [facility]  Pager: [phone] 2:26 PM, 09/30/2020

## 2020-09-30 NOTE — Consult Note (Signed)
Reason for Consult:pancreatitis Referring Physician: Dr Rennie Natter Nathaniel Hickman is an 55 y.o. male.  HPI: 35 yom who has several weeks abdominal pain with nausea. Not eating as much.  He has been using nsaids.  No prior history. No surgery.  He states he drinks a 6 pack per week.  Was evaluated and found to have elevated lipase, gallstones on [place] and a peripanc fluid collection. He has no real complaints except for improving ab pain when I see him.    No past medical history on file.  No past surgical history on file.  No family history on file.  Social History:  has no history on file for tobacco use, alcohol use, and drug use.  Allergies: No Known Allergies  Medications: I have reviewed the patient's current medications.  Results for orders placed or performed during the hospital encounter of 09/29/20 (from the past 48 hour(s))  Urinalysis, Routine w reflex microscopic Urine, Clean Catch     Status: Abnormal   Collection Time: 09/29/20  3:26 AM  Result Value Ref Range   Color, Urine AMBER (A) YELLOW    Comment: BIOCHEMICALS MAY BE AFFECTED BY COLOR   APPearance HAZY (A) CLEAR   Specific Gravity, Urine 1.023 1.005 - 1.030   pH 5.0 5.0 - 8.0   Glucose, UA NEGATIVE NEGATIVE mg/dL   Hgb urine dipstick NEGATIVE NEGATIVE   Bilirubin Urine NEGATIVE NEGATIVE   Ketones, ur NEGATIVE NEGATIVE mg/dL   Protein, ur 30 (A) NEGATIVE mg/dL   Nitrite NEGATIVE NEGATIVE   Leukocytes,Ua TRACE (A) NEGATIVE   RBC / HPF 0-5 0 - 5 RBC/hpf   WBC, UA 0-5 0 - 5 WBC/hpf   Bacteria, UA NONE SEEN NONE SEEN   Squamous Epithelial / LPF 0-5 0 - 5   Mucus PRESENT    Hyaline Casts, UA PRESENT     Comment: Performed at [facility], [address], [place], [place] [address]  Lipase, blood     Status: Abnormal   Collection Time: 09/29/20  3:36 AM  Result Value Ref Range   Lipase 315 (H) 11 - 51 U/L    Comment: Performed at [facility], [address] [place], [place] [address]  Comprehensive  metabolic panel     Status: Abnormal   Collection Time: 09/29/20  3:36 AM  Result Value Ref Range   Sodium 128 (L) 135 - 145 mmol/L   Potassium 3.9 3.5 - 5.1 mmol/L   Chloride 93 (L) 98 - 111 mmol/L   CO2 25 22 - 32 mmol/L   Glucose, Bld 94 70 - 99 mg/dL    Comment: Glucose reference range applies only to samples taken after fasting for at least 8 hours.   BUN <5 (L) 6 - 20 mg/dL   Creatinine, Ser 6.29 0.61 - 1.24 mg/dL   Calcium 9.5 8.9 - 52.8 mg/dL   Total Protein 7.4 6.5 - 8.1 g/dL   Albumin 3.6 3.5 - 5.0 g/dL   AST 36 15 - 41 U/L   ALT 36 0 - 44 U/L   Alkaline Phosphatase 78 38 - 126 U/L   Total Bilirubin 1.0 0.3 - 1.2 mg/dL   GFR, Estimated >41 >32 mL/min    Comment: (NOTE) Calculated using the CKD-EPI Creatinine Equation (2021)    Anion gap 10 5 - 15    Comment: Performed at [facility], [address] [place], [place] [address]  CBC     Status: Abnormal  Collection Time: 09/29/20  3:36 AM  Result Value Ref Range   WBC 6.4 4.0 - 10.5 K/uL   RBC 3.48 (L) 4.22 - 5.81 MIL/uL   Hemoglobin 12.5 (L) 13.0 - 17.0 g/dL   HCT 93.2 (L) 67.1 - 24.5 %   MCV 104.9 (H) 80.0 - 100.0 fL   MCH 35.9 (H) 26.0 - 34.0 pg   MCHC 34.2 30.0 - 36.0 g/dL   RDW 80.9 98.3 - 38.2 %   Platelets 141 (L) 150 - 400 K/uL    Comment: REPEATED TO VERIFY   nRBC 0.0 0.0 - 0.2 %    Comment: Performed at [facility], [address] [place], [place] [address]  Resp Panel by RT-PCR (Flu A&B, Covid) Nasopharyngeal Swab     Status: None   Collection Time: 09/29/20  6:50 AM   Specimen: Nasopharyngeal Swab; Nasopharyngeal(NP) swabs in vial transport medium  Result Value Ref Range   SARS Coronavirus 2 by RT PCR NEGATIVE NEGATIVE    Comment: (NOTE) SARS-CoV-2 target nucleic acids are NOT DETECTED.  The SARS-CoV-2 RNA is generally detectable in upper respiratory specimens during the acute phase of infection. The lowest concentration of SARS-CoV-2 viral copies this assay can detect is 138  copies/mL. A negative result does not preclude SARS-Cov-2 infection and should not be used as the sole basis for treatment or other patient management decisions. A negative result may occur with  improper specimen collection/handling, submission of specimen other than nasopharyngeal swab, presence of viral mutation(s) within the areas targeted by this assay, and inadequate number of viral copies(<138 copies/mL). A negative result must be combined with clinical observations, patient history, and epidemiological information. The expected result is Negative.  Fact Sheet for Patients:  BloggerCourse.com  Fact Sheet for Healthcare Providers:  SeriousBroker.it  This test is no t yet approved or cleared by the [place] FDA and  has been authorized for detection and/or diagnosis of SARS-CoV-2 by FDA under an Emergency Use Authorization (EUA). This EUA will remain  in effect (meaning this test can be used) for the duration of the COVID-19 declaration under Section 564(b)(1) of the Act, 21 U.S.C.section 360bbb-3(b)(1), unless the authorization is terminated  or revoked sooner.       Influenza A by PCR NEGATIVE NEGATIVE   Influenza B by PCR NEGATIVE NEGATIVE    Comment: (NOTE) The Xpert Xpress SARS-CoV-2/FLU/RSV plus assay is intended as an aid in the diagnosis of influenza from Nasopharyngeal swab specimens and should not be used as a sole basis for treatment. Nasal washings and aspirates are unacceptable for Xpert Xpress SARS-CoV-2/FLU/RSV testing.  Fact Sheet for Patients: BloggerCourse.com  Fact Sheet for Healthcare Providers: SeriousBroker.it  This test is not yet approved or cleared by the [place] FDA and has been authorized for detection and/or diagnosis of SARS-CoV-2 by FDA under an Emergency Use Authorization (EUA). This EUA will remain in effect (meaning this test  can be used) for the duration of the COVID-19 declaration under Section 564(b)(1) of the Act, 21 U.S.C. section 360bbb-3(b)(1), unless the authorization is terminated or revoked.  Performed at [facility], [address] [place], [place] [address]   Sodium, urine, random     Status: None   Collection Time: 09/29/20 11:19 AM  Result Value Ref Range   Sodium, Ur 33 mmol/L    Comment: Performed at [facility], [address], [place], [place] [address]  Osmolality, urine     Status: None  Collection Time: 09/29/20 11:19 AM  Result Value Ref Range   Osmolality, Ur 388 300 - 900 mOsm/kg    Comment: Performed at [facility], [address] [place], [place] [address]  Basic metabolic panel     Status: Abnormal   Collection Time: 09/29/20  5:52 PM  Result Value Ref Range   Sodium 132 (L) 135 - 145 mmol/L   Potassium 4.0 3.5 - 5.1 mmol/L   Chloride 99 98 - 111 mmol/L   CO2 24 22 - 32 mmol/L   Glucose, Bld 95 70 - 99 mg/dL    Comment: Glucose reference range applies only to samples taken after fasting for at least 8 hours.   BUN <5 (L) 6 - 20 mg/dL   Creatinine, Ser 2.130.72 0.61 - 1.24 mg/dL   Calcium 9.1 8.9 - 08.610.3 mg/dL   GFR, Estimated >57>60 >84>60 mL/min    Comment: (NOTE) Calculated using the CKD-EPI Creatinine Equation (2021)    Anion gap 9 5 - 15    Comment: Performed at [facility], [address] [place], [place] [address]  HIV Antibody (routine testing w rflx)     Status: None   Collection Time: 09/30/20  2:25 AM  Result Value Ref Range   HIV Screen 4th Generation wRfx Non Reactive Non Reactive    Comment: Performed at [facility], [address] [place], [place] [address]  CBC     Status: Abnormal   Collection Time: 09/30/20  2:25 AM  Result Value Ref Range   WBC 3.9 (L) 4.0 - 10.5 K/uL   RBC 3.25 (L) 4.22 - 5.81 MIL/uL   Hemoglobin 11.8 (L) 13.0 - 17.0 g/dL   HCT 32.433.7 (L) 40.139.0 - 02.752.0 %   MCV 103.7 (H) 80.0 - 100.0 fL   MCH 36.3 (H)  26.0 - 34.0 pg   MCHC 35.0 30.0 - 36.0 g/dL   RDW 25.312.5 66.411.5 - 40.315.5 %   Platelets 126 (L) 150 - 400 K/uL    Comment: Immature Platelet Fraction may be clinically indicated, consider ordering this additional test [id number] CONSISTENT WITH PREVIOUS RESULT REPEATED TO VERIFY    nRBC 0.0 0.0 - 0.2 %    Comment: Performed at [facility], [address] [place], [place] [address]  Vitamin B12     Status: None   Collection Time: 09/30/20  2:25 AM  Result Value Ref Range   Vitamin B-12 369 180 - 914 pg/mL    Comment: (NOTE) This assay is not validated for testing neonatal or myeloproliferative syndrome specimens for Vitamin B12 levels. Performed at [facility], [address] [place], [place] [address]   Folate, serum, performed at [facility] lab     Status: None   Collection Time: 09/30/20  2:25 AM  Result Value Ref Range   Folate 13.0 >5.9 ng/mL    Comment: Performed at [facility], [address] [place], [place] [address]  Lipid panel     Status: None   Collection Time: 09/30/20  2:25 AM  Result Value Ref Range   Cholesterol 158 0 - 200 mg/dL   Triglycerides 55 <166<150 mg/dL   HDL 86 >06>40 mg/dL   Total CHOL/HDL Ratio 1.8 RATIO   VLDL 11 0 - 40 mg/dL   LDL Cholesterol 61 0 - 99 mg/dL    Comment:        Total Cholesterol/HDL:CHD Risk Coronary Heart Disease Risk Table  Men   Women  1/2 Average Risk   3.4   3.3  Average Risk       5.0   4.4  2 X Average Risk   9.6   7.1  3 X Average Risk  23.4   11.0        Use the calculated Patient Ratio above and the CHD Risk Table to determine the patient's CHD Risk.        ATP III CLASSIFICATION (LDL):  <100     mg/dL   Optimal  093-818  mg/dL   Near or Above                    Optimal  130-159  mg/dL   Borderline  299-371  mg/dL   High  >696     mg/dL   Very High Performed at [facility], [address], [place], [place] [address]   Basic metabolic panel     Status: Abnormal    Collection Time: 09/30/20  2:25 AM  Result Value Ref Range   Sodium 133 (L) 135 - 145 mmol/L   Potassium 3.7 3.5 - 5.1 mmol/L   Chloride 99 98 - 111 mmol/L   CO2 25 22 - 32 mmol/L   Glucose, Bld 89 70 - 99 mg/dL    Comment: Glucose reference range applies only to samples taken after fasting for at least 8 hours.   BUN <5 (L) 6 - 20 mg/dL   Creatinine, Ser 1.01 0.61 - 1.24 mg/dL   Calcium 9.3 8.9 - 75.1 mg/dL   GFR, Estimated >02 >58 mL/min    Comment: (NOTE) Calculated using the CKD-EPI Creatinine Equation (2021)    Anion gap 9 5 - 15    Comment: Performed at [facility], [address], [place], [place] [address]  Ferritin     Status: None   Collection Time: 09/30/20  2:25 AM  Result Value Ref Range   Ferritin 152 24 - 336 ng/mL    Comment: Performed at [facility], [address] [place], [place] [address]  Iron and TIBC     Status: Abnormal   Collection Time: 09/30/20  2:25 AM  Result Value Ref Range   Iron 52 45 - 182 ug/dL   TIBC 361 443 - 154 ug/dL   Saturation Ratios 17 (L) 17.9 - 39.5 %   UIBC 246 ug/dL    Comment: Performed at [facility], [address] [place], [place] [address]    CT ABDOMEN PELVIS W CONTRAST  Result Date: 09/29/2020 CLINICAL DATA:  Abdominal pain, question pancreatitis EXAM: CT ABDOMEN AND PELVIS WITH CONTRAST TECHNIQUE: Multidetector CT imaging of the abdomen and pelvis was performed using the standard protocol following bolus administration of intravenous contrast. CONTRAST:  53mL OMNIPAQUE IOHEXOL 350 MG/ML SOLN COMPARISON:  None. FINDINGS: Lower chest: There is dependent subsegmental atelectasis in the lung bases. The imaged heart is unremarkable. Hepatobiliary: The liver is diffusely hypoattenuating consistent with fatty infiltration. There are no focal lesions. The gallbladder is unremarkable. There is no biliary ductal dilatation. Pancreas: There is stranding in the peripancreatic fat predominantly around the body/tail.  There is no main pancreatic ductal dilatation. Spleen: There is a 6 mm enhancing lesion in the superior portion of the spleen. The spleen is otherwise unremarkable. Adrenals/Urinary Tract: The adrenals are unremarkable. A subcentimeter lesion in the left kidney lower pole is too small to characterize but likely reflects a cyst. The  kidneys are otherwise normal, with no other focal lesion, stone, hydronephrosis, or hydroureter. The bladder is unremarkable. Stomach/Bowel: There is a 5.3 cm AP by 2.0 cm TV by 3.4 cm cc peripherally enhancing collection along the greater curvature of the stomach. The stomach wall appears somewhat thickened, though this is likely due to underdistention. There is no evidence of bowel obstruction. There is mild fluid distention of multiple loops of small and large bowel. The appendix is normal. Vascular/Lymphatic: The abdominal aorta is nonaneurysmal. The main portal and splenic veins are patent. There is no abdominal or pelvic lymphadenopathy. Reproductive: The prostate and seminal vesicles are unremarkable. Other: There is scattered free fluid throughout the abdomen and pelvis with mild inflammatory stranding in the mesenteric fat. Musculoskeletal: There is a benign-appearing sclerotic lesion in the left iliac bone. There is no acute osseous abnormality or aggressive osseous lesion. IMPRESSION: 1. Findings above consistent with acute interstitial pancreatitis. 2. 5.3 cm x 2.0 cm x 3.4 cm fluid collection centered along the greater curvature of the stomach likely reflects an acute peripancreatic fluid collection/developing pseudocyst. Infection cannot be excluded by imaging. 3. Scattered ascites and inflammatory fat stranding throughout the remainder of the abdomen/pelvis. 4. Hepatic steatosis. 5. 6 mm enhancing lesion in the spleen is indeterminate but likely benign. Attention on follow-up studies. Electronically Signed   By: Lesia Hausen M.D.   On: 09/29/2020 08:16   US Abdomen  Limited RUQ (LIVER/GB)  Result Date: 09/29/2020 CLINICAL DATA:  Right upper quadrant pain EXAM: ULTRASOUND ABDOMEN LIMITED RIGHT UPPER QUADRANT COMPARISON:  CT 09/29/2020 FINDINGS: Gallbladder: Small gallstones. Normal wall thickness. Negative sonographic Murphy. Trace fluid adjacent to gallbladder. Common bile duct: Diameter: 3 mm Liver: Diffusely echogenic. No focal hepatic abnormality portal vein is patent on color Doppler imaging with normal direction of blood flow towards the liver. Other: None. IMPRESSION: 1. Cholelithiasis with trace fluid adjacent to gallbladder but no wall thickening or sonographic Murphy. If further imaging is required, evaluation with nuclear medicine hepatobiliary imaging could be obtained 2. Echogenic liver consistent with steatosis and or hepatocellular disease Electronically Signed   By: Jasmine Pang M.D.   On: 09/29/2020 19:09    Review of Systems  Gastrointestinal:  Positive for abdominal pain and nausea.  All other systems reviewed and are negative. Blood pressure 119/84, pulse 80, temperature 98.6 F (37 C), temperature source Oral, resp. rate 16, height  (1.651 m), weight 47.3 kg, SpO2 100 %. Physical Exam Constitutional:      Appearance: He is well-developed.  Eyes:     General: No scleral icterus. Cardiovascular:     Rate and Rhythm: Normal rate and regular rhythm.  Pulmonary:     Effort: Pulmonary effort is normal.  Abdominal:     General: There is no distension.     Palpations: Abdomen is soft.     Tenderness: There is abdominal tenderness (mild) in the epigastric area.     Hernia: No hernia is present.  Skin:    General: Skin is warm and dry.     Capillary Refill: Capillary refill takes less than 2 seconds.  Neurological:     General: No focal deficit present.     Mental Status: He is alert.  Psychiatric:        Mood and Affect: Mood normal.    Assessment/Plan: Pancreatitis, possibly gallstone related -he certainly could have etoh  pancreatitis but he does have gallstones. I think he will need lap chole at some point. With developing pseudocyst I think  delayed likely better -continue care for pancreatitis. -I can see him in office after this to discuss lap chole   Emelia Loron 09/30/2020, 1:33 PM

## 2020-09-30 NOTE — Discharge Instructions (Addendum)
Mr. Savyon, [place] were hospitalized due to pancreatitis (inflammation of your pancreas) likely due to gallstones (stones in your gallbladder). You will ultimately need to have your gallbladder removed. I also advise you to discontinuing alcohol which may have contributed. I will set you up to follow up in our Internal medicine clinic. And surgery will also follow up with you outpatient.   Thanks for allowing [place] to be a part of your care!                 Intensive Outpatient Programs  [facility]    [facility] [address]     [address] [place],  [place]     [place], [place] [phone]      [phone]  [facility]   [facility]  (Inpatient and outpatient)  [phone] (Suboxone and Methadone) [address]           [phone]           [facility]    [facility] - Intensive Outpatient [address]     [address] [place], [place] [address]     [place], [place]  [phone]      [phone]  [facility] (Outpatient, Inpatient, [facility]  [facility] (Groups and Residental) (insurance only) [phone]    [place], [place]          [phone]       [facility]    [facility] (for caregivers and family) [address]     [address] [place], [place]     [place], [place] [phone]      [phone]  Residential Treatment Programs  [facility]  [place](2 years) Residential: 90 days)  [facility] ([facility]      [address] [place], [place]     [place], [place] [phone]      [phone] or [phone]  [facility]    [facility] [address]      [address] [place], [place]     [place], [place] [phone]      [phone]  [facility]   Residential  Treatment Services (RTS) [address]     [address] [place], [place] [address]     [place], [place] [phone]      [phone] Admissions: 8am-3pm M-F  [facility]: Residential Program [phone] Days)              [facility]: [facility]  [place], [place]     [place], [place]  [phone] or [phone]    (Walk in Hours over the weekend or by referral)   [facility] Crisis: [facility]:[phone] (for crisis response 24 hours a day)

## 2020-09-30 NOTE — Progress Notes (Signed)
Discharge instructions reviewed and given to pt, pt acknowledge understanding. This rn informed pt pancreatitis can be caused by alcohol use and gallstones. Pt became agitated/irritated when rn reinforced importance of no alcohol use list of substance abuse centers and f/u with md to discuss tx of gallstone commenting "I only drink 3 beers a day now"

## 2020-09-30 NOTE — Hospital Course (Addendum)
[name] is a 55 year old male with pertinent PMH of chronic back pain who presented with abdominal pain and was admitted for acute pancreatitis on hospital day 1.  #Acute pancreatitis  Patient presented with epigastric abdominal pain radiating to chest, back, and diffusely across abdomen. Abdomen was diffusely tender on exam without rebound or guarding. No signs of peritonitis. Lipase elevated to 315. CT Abdomen/Pelvis showed acute interstitial pancreatitis. Patient started on pain control (IV dilaudid 0.5mg  q4h prn) and antiemetic (IV Zofran 4mg  q8h prn) PRN and received one dose of each. Etiology of patient's pancreatitis is likely secondary to alcohol use vs NSAID use. He drinks 3 beers per day on average, and alcohol cessation was encouraged. He also endorses daily fast food intake, and dietary changes encouraged. Lipid panel within normal limits. Calcium within normal limits. No significant LFT elevations, less likely to be in setting of gallstone pancreatitis.  Ultrasound showed cholelithiasis, an echogenic liver, and trace fluid next to the gallbladder. Peripancreatic fluid collection noted on CT - 5x2x3cm - concerns for developing pseudocyst. Although he does have abdominal pain and nausea, concerning for possible mass effect, these symptoms are likely secondary to acute pancreatitis. Discussed possible aspiration with IR; however, does not have good windows for access. If pseudocyst persists and causes symptoms once he is over acute pancreatitis flare, can consider GI consult for drainage.   Diet advanced to solid foods the morning of 9/3.   #Hypotonic hyponatremia Na 128 on presentation. Possibly in setting of decreased oral intake in setting of acute pancreatitis as above vs alcohol use. Currently asymptomatic. A continuous infusion of 0.9% sodium chloride was started 9/1. Na improved to 133 overnight.   #Alcohol use  Patient reports daily alcohol use and consumes three beers each  day on average. He did not appear to be in acute withdrawal. CIWA score was 2 due to agitation only around 0500 on 9/3, and he has not needed Ativan. Given tablet of Thiamine 100mg . Alcohol cessation encouraged.   #Macrocytic anemia  This appears to be chronic. Likely in setting of alcohol use. Vitamin B12 and folate levels were normal. 11/3 dose of Vitamin B12 administered 9/3.

## 2020-09-30 NOTE — Progress Notes (Signed)
Patient agitated when RN had to hang another bag of IV fluid. He requested RN takes iv out and does not need any more fluids.

## 2020-10-06 ENCOUNTER — Telehealth: Payer: Self-pay | Admitting: Internal Medicine

## 2020-10-06 NOTE — Telephone Encounter (Signed)
TOC NEW HFU  Date: 10/13/2020    Time: 10:30 AM    Visit Type: OPEN ESTABLISHED [726]    Provider: Gwenevere Abbot, MD

## 2020-10-08 ENCOUNTER — Inpatient Hospital Stay (HOSPITAL_COMMUNITY)
Admission: EM | Admit: 2020-10-08 | Discharge: 2020-10-12 | DRG: 438 | Disposition: A | Discharge status: Home / Self Care | Payer: Self-pay | Attending: Internal Medicine | Admitting: Internal Medicine

## 2020-10-08 ENCOUNTER — Encounter (HOSPITAL_COMMUNITY): Payer: Self-pay

## 2020-10-08 ENCOUNTER — Other Ambulatory Visit: Payer: Self-pay

## 2020-10-08 ENCOUNTER — Emergency Department (HOSPITAL_COMMUNITY): Payer: Self-pay

## 2020-10-08 DIAGNOSIS — F1010 Alcohol abuse, uncomplicated: Secondary | ICD-10-CM | POA: Diagnosis present

## 2020-10-08 DIAGNOSIS — R64 Cachexia: Secondary | ICD-10-CM | POA: Diagnosis present

## 2020-10-08 DIAGNOSIS — K859 Acute pancreatitis without necrosis or infection, unspecified: Principal | ICD-10-CM | POA: Diagnosis present

## 2020-10-08 DIAGNOSIS — Z681 Body mass index (BMI) 19 or less, adult: Secondary | ICD-10-CM

## 2020-10-08 DIAGNOSIS — Z20822 Contact with and (suspected) exposure to covid-19: Secondary | ICD-10-CM | POA: Diagnosis present

## 2020-10-08 DIAGNOSIS — E43 Unspecified severe protein-calorie malnutrition: Secondary | ICD-10-CM | POA: Diagnosis present

## 2020-10-08 DIAGNOSIS — K863 Pseudocyst of pancreas: Secondary | ICD-10-CM | POA: Diagnosis present

## 2020-10-08 DIAGNOSIS — K8020 Calculus of gallbladder without cholecystitis without obstruction: Secondary | ICD-10-CM | POA: Diagnosis present

## 2020-10-08 DIAGNOSIS — K858 Other acute pancreatitis without necrosis or infection: Secondary | ICD-10-CM | POA: Diagnosis present

## 2020-10-08 DIAGNOSIS — R1013 Epigastric pain: Secondary | ICD-10-CM

## 2020-10-08 LAB — CBC WITH DIFFERENTIAL/PLATELET
Abs Immature Granulocytes: 0.01 10*3/uL (ref 0.00–0.07)
Basophils Absolute: 0 10*3/uL (ref 0.0–0.1)
Basophils Relative: 1 %
Eosinophils Absolute: 0.1 10*3/uL (ref 0.0–0.5)
Eosinophils Relative: 2 %
HCT: 34.5 % — ABNORMAL LOW (ref 39.0–52.0)
Hemoglobin: 11.8 g/dL — ABNORMAL LOW (ref 13.0–17.0)
Immature Granulocytes: 0 %
Lymphocytes Relative: 19 %
Lymphs Abs: 1.3 10*3/uL (ref 0.7–4.0)
MCH: 35.4 pg — ABNORMAL HIGH (ref 26.0–34.0)
MCHC: 34.2 g/dL (ref 30.0–36.0)
MCV: 103.6 fL — ABNORMAL HIGH (ref 80.0–100.0)
Monocytes Absolute: 0.5 10*3/uL (ref 0.1–1.0)
Monocytes Relative: 8 %
Neutro Abs: 4.9 10*3/uL (ref 1.7–7.7)
Neutrophils Relative %: 70 %
Platelets: 208 10*3/uL (ref 150–400)
RBC: 3.33 MIL/uL — ABNORMAL LOW (ref 4.22–5.81)
RDW: 13 % (ref 11.5–15.5)
WBC: 6.9 10*3/uL (ref 4.0–10.5)
nRBC: 0 % (ref 0.0–0.2)

## 2020-10-08 LAB — URINALYSIS, ROUTINE W REFLEX MICROSCOPIC
Bacteria, UA: NONE SEEN
Bilirubin Urine: NEGATIVE
Glucose, UA: NEGATIVE mg/dL
Hgb urine dipstick: NEGATIVE
Ketones, ur: NEGATIVE mg/dL
Leukocytes,Ua: NEGATIVE
Nitrite: NEGATIVE
Protein, ur: NEGATIVE mg/dL
Specific Gravity, Urine: <=1.005 (ref 1.005–1.030)
pH: 7.5 (ref 5.0–8.0)

## 2020-10-08 LAB — COMPREHENSIVE METABOLIC PANEL
ALT: 27 U/L (ref 0–44)
AST: 44 U/L — ABNORMAL HIGH (ref 15–41)
Albumin: 4 g/dL (ref 3.5–5.0)
Alkaline Phosphatase: 72 U/L (ref 38–126)
Anion gap: 11 (ref 5–15)
BUN: 7 mg/dL (ref 6–20)
CO2: 25 mmol/L (ref 22–32)
Calcium: 9.3 mg/dL (ref 8.9–10.3)
Chloride: 98 mmol/L (ref 98–111)
Creatinine, Ser: 0.5 mg/dL — ABNORMAL LOW (ref 0.61–1.24)
GFR, Estimated: >60 mL/min (ref >60)
Glucose, Bld: 83 mg/dL (ref 70–99)
Potassium: 3.9 mmol/L (ref 3.5–5.1)
Sodium: 134 mmol/L — ABNORMAL LOW (ref 135–145)
Total Bilirubin: 0.4 mg/dL (ref 0.3–1.2)
Total Protein: 7.7 g/dL (ref 6.5–8.1)

## 2020-10-08 LAB — RESP PANEL BY RT-PCR (FLU A&B, COVID) ARPGX2
Influenza A by PCR: NEGATIVE
Influenza B by PCR: NEGATIVE
SARS Coronavirus 2 by RT PCR: NEGATIVE

## 2020-10-08 LAB — LIPASE, BLOOD: Lipase: 106 U/L — ABNORMAL HIGH (ref 11–51)

## 2020-10-08 MED ORDER — ACETAMINOPHEN 325 MG PO TABS
650.0000 mg | ORAL_TABLET | Freq: Four times a day (QID) | ORAL | Status: DC | PRN
  Administered 2020-10-09 – 2020-10-11 (×4): 650 mg via ORAL
  Filled 2020-10-08 (×4): qty 2

## 2020-10-08 MED ORDER — ONDANSETRON HCL 4 MG/2ML IJ SOLN
4.0000 mg | Freq: Once | INTRAMUSCULAR | Status: AC
  Administered 2020-10-08: 4 mg via INTRAVENOUS
  Filled 2020-10-08: qty 2

## 2020-10-08 MED ORDER — ONDANSETRON HCL 4 MG/2ML IJ SOLN
4.0000 mg | Freq: Four times a day (QID) | INTRAMUSCULAR | Status: DC | PRN
  Administered 2020-10-11: 4 mg via INTRAVENOUS
  Filled 2020-10-08: qty 2

## 2020-10-08 MED ORDER — MORPHINE SULFATE (PF) 4 MG/ML IV SOLN
4.0000 mg | Freq: Once | INTRAVENOUS | Status: AC
Start: 2020-10-08 — End: 2020-10-08
  Administered 2020-10-08: 4 mg via INTRAVENOUS
  Filled 2020-10-08: qty 1

## 2020-10-08 MED ORDER — MORPHINE SULFATE (PF) 4 MG/ML IV SOLN
4.0000 mg | INTRAVENOUS | Status: DC | PRN
  Administered 2020-10-09: 4 mg via INTRAVENOUS
  Filled 2020-10-08: qty 1

## 2020-10-08 MED ORDER — LORAZEPAM 2 MG/ML IJ SOLN: 1.0000 mg | INTRAMUSCULAR | Status: DC | PRN

## 2020-10-08 MED ORDER — IOHEXOL 350 MG/ML SOLN
80.0000 mL | Freq: Once | INTRAVENOUS | Status: AC | PRN
  Administered 2020-10-08: 80 mL via INTRAVENOUS

## 2020-10-08 MED ORDER — ACETAMINOPHEN 650 MG RE SUPP: 650.0000 mg | Freq: Four times a day (QID) | RECTAL | Status: DC | PRN

## 2020-10-08 MED ORDER — LACTATED RINGERS IV SOLN: INTRAVENOUS | Status: DC

## 2020-10-08 MED ORDER — THIAMINE HCL 100 MG PO TABS
100.0000 mg | ORAL_TABLET | Freq: Every day | ORAL | Status: DC
  Administered 2020-10-09 – 2020-10-12 (×4): 100 mg via ORAL
  Filled 2020-10-08 (×4): qty 1

## 2020-10-08 MED ORDER — ADULT MULTIVITAMIN W/MINERALS CH
1.0000 | ORAL_TABLET | Freq: Every day | ORAL | Status: DC
  Administered 2020-10-09 – 2020-10-12 (×4): 1 via ORAL
  Filled 2020-10-08 (×4): qty 1

## 2020-10-08 MED ORDER — LORAZEPAM 1 MG PO TABS
1.0000 mg | ORAL_TABLET | ORAL | Status: DC | PRN
  Administered 2020-10-10: 1 mg via ORAL
  Filled 2020-10-08: qty 1

## 2020-10-08 MED ORDER — THIAMINE HCL 100 MG/ML IJ SOLN
100.0000 mg | Freq: Every day | INTRAMUSCULAR | Status: DC
  Filled 2020-10-08: qty 2

## 2020-10-08 MED ORDER — PANTOPRAZOLE SODIUM 40 MG IV SOLR
40.0000 mg | INTRAVENOUS | Status: DC
  Administered 2020-10-08 – 2020-10-11 (×4): 40 mg via INTRAVENOUS
  Filled 2020-10-08 (×4): qty 40

## 2020-10-08 MED ORDER — FOLIC ACID 1 MG PO TABS
1.0000 mg | ORAL_TABLET | Freq: Every day | ORAL | Status: DC
  Administered 2020-10-09 – 2020-10-12 (×4): 1 mg via ORAL
  Filled 2020-10-08 (×4): qty 1

## 2020-10-08 MED ORDER — ONDANSETRON HCL 4 MG PO TABS: 4.0000 mg | ORAL_TABLET | Freq: Four times a day (QID) | ORAL | Status: DC | PRN

## 2020-10-08 MED ORDER — ENOXAPARIN SODIUM 40 MG/0.4ML IJ SOSY
40.0000 mg | PREFILLED_SYRINGE | INTRAMUSCULAR | Status: DC
  Administered 2020-10-09 – 2020-10-12 (×4): 40 mg via SUBCUTANEOUS
  Filled 2020-10-08 (×4): qty 0.4

## 2020-10-08 MED ORDER — MORPHINE SULFATE (PF) 2 MG/ML IV SOLN
2.0000 mg | INTRAVENOUS | Status: DC | PRN
  Administered 2020-10-08 – 2020-10-10 (×4): 2 mg via INTRAVENOUS
  Filled 2020-10-08 (×4): qty 1

## 2020-10-08 NOTE — ED Provider Notes (Signed)
Emergency Medicine Provider Triage Evaluation Note  [place] Valli , a 55 y.o. male  was evaluated in triage.  Pt complains of epigastric abdominal pain that started 3 hours prior to arrival.  Patient states pain started after eating breakfast.  He admits to going out yesterday to the [facility] and drinking some beers.  He admits to occasional alcohol use.  No chronic NSAIDs however, states he takes NSAIDs intermittently.  Admits to nausea however, denies vomiting and diarrhea.  Last bowel movement was earlier today which was normal.  No fever or chills.  No previous abdominal operations.  Review of Systems  Positive: Abdominal pain Negative: fever  Physical Exam  BP (!) 116/94   Pulse 99   Temp 98.6 F (37 C) (Oral)   Resp 18   SpO2 99%  Gen:   Awake, no distress   Resp:  Normal effort  MSK:   Moves extremities without difficulty  Other:  Severe tenderness in upper abdomen (patient did not allow me to assess full abdomen due to pain)  Medical Decision Making  Medically screening exam initiated at 12:43 PM.  Appropriate orders placed.  Cray Monnin was informed that the remainder of the evaluation will be completed by another provider, this initial triage assessment does not replace that evaluation, and the importance of remaining in the ED until their evaluation is complete.  Abdominal labs CT abdomen EKG   Jesusita Oka 10/08/20 1245    Ernie Avena, MD 10/08/20 1432

## 2020-10-08 NOTE — ED Triage Notes (Signed)
Pt arrives via ems. Pt c/o abdominal that started about 3 hours ago.

## 2020-10-08 NOTE — H&P (Signed)
History and Physical    Nathaniel Hickman KVQ:[id number] DOB: 19-Sep-1965 DOA: 10/08/2020  PCP: Pcp, No  Patient coming from: Home via EMS   Chief Complaint:  Chief Complaint  Patient presents with   Abdominal Pain     HPI:    55 year old male with past medical history of recent diagnosis of pancreatitis who presents to [facility] emergency department with complaints of abdominal pain.  Of note, patient was admitted to [facility] to the internal medicine residency service from 9/2 until 9/3.  For pancreatitis which was managed per usual care.    There was discussion as to whether patient's pancreatitis was due to alcohol use or gallbladder pancreatitis.  Patient was advised to cease alcohol use and was advised to follow-up with general surgery after discharge.  Patient was discharged home on 9/3.  Patient explains that he felt well for approximately 3 to 4 days after discharge but following that the patient began to develop recurrent epigastric and left upper quadrant pain.  Patient describes this pain as severe in intensity, sharp in quality and radiating to the mid back.  Pain is worse with movement or ingestion of foods.  Pain is improved with rest.  Patient attempted to take ibuprofen and BC powder on multiple occasions to control his pain with no improvement in symptoms.  Patient denies associated diarrhea, nausea, vomiting, recent ingestion of undercooked food, sick contacts or recent contact with COVID-19 infection.  Upon further questioning, patient states that he continues to drink somewhat regularly.  Patient states that he typically drinks at least 6 beers in 1 sitting on at least 3 nights a week.  Since patient's discharge however he has continued to drink alcohol but states that it has been less.  Patient's pain continued to worsen until EMS was contacted who promptly brought the patient into [facility] emergency department for evaluation.  Upon  evaluation in the emergency department patient was found to have a slightly elevated lipase of 106, down from 315 during his last hospitalization.  CT imaging of the abdomen pelvis revealed residual edema and fluid anterior to the body and tail of the pancreas concerning for ongoing pancreatitis with slight interval decrease in the size of a rim-enhancing fluid collection near the greater curvature of the stomach.  However, an interval development of the 2.8 cm organizing fluid collection between the stomach and body of the pancreas has now developed concerning for an additional pseudocyst.  Patient was provided 4 mg of intravenous morphine for substantial abdominal pain.  Patient initiated on intravenous fluids.  The hospitalist group was then called to assess the patient for admission to the hospital.  Review of Systems:   Review of Systems  Constitutional:  Positive for malaise/fatigue.  Gastrointestinal:  Positive for abdominal pain and nausea.  All other systems reviewed and are negative.  History reviewed. No pertinent past medical history.  History reviewed. No pertinent surgical history.   reports that he has never smoked. He has never used smokeless tobacco. He reports current alcohol use of about 18.0 standard drinks per week. He reports that he does not currently use drugs.  No Known Allergies  Family History  Problem Relation Age of Onset   Heart disease Neg Hx      Prior to Admission medications   Medication Sig Start Date End Date Taking? Authorizing Provider  Aspirin-Salicylamide-Caffeine (BC HEADACHE POWDER PO) Take 1 packet by mouth daily.   Yes [provider]  ibuprofen (ADVIL) 200  MG tablet Take 400 mg by mouth every 6 (six) hours as needed for mild pain.   Yes [provider]    Physical Exam: Vitals:   10/08/20 2015 10/08/20 2100 10/08/20 2115 10/08/20 2147  BP: (!) 127/96 (!) 120/94 (!) 122/94 (!) 137/98  Pulse: 76 73 72 77  Resp:    20   Temp:    97.6 F (36.4 C)  TempSrc:      SpO2: 100% 99% 99% 100%  Weight:      Height:        Constitutional: Awake alert and oriented x3, no associated distress.  Patient is cachectic. Skin: no rashes, no lesions, poor skin turgor noted. Eyes: Pupils are equally reactive to light.  No evidence of scleral icterus or conjunctival pallor.  ENMT: Dry mucous membranes noted.  Posterior pharynx clear of any exudate or lesions.   Neck: normal, supple, no masses, no thyromegaly.  No evidence of jugular venous distension.   Respiratory: clear to auscultation bilaterally, no wheezing, no crackles. Normal respiratory effort. No accessory muscle use.  Cardiovascular: Regular rate and rhythm, no murmurs / rubs / gallops. No extremity edema. 2+ pedal pulses. No carotid bruits.  Chest:   Nontender without crepitus or deformity.   Back:   Nontender without crepitus or deformity. Abdomen: Substantial epigastric and left upper quadrant tenderness.  Abdomen is soft.   No evidence of intra-abdominal masses.  Positive bowel sounds noted in all quadrants.   Musculoskeletal: No joint deformity upper and lower extremities. Good ROM, no contractures. Normal muscle tone.  Neurologic: CN 2-12 grossly intact. Sensation intact.  Patient moving all 4 extremities spontaneously.  Patient is following all commands.  Patient is responsive to verbal stimuli.   Psychiatric: Patient exhibits normal mood with appropriate affect.  Patient seems to possess insight as to their current situation.     Labs on Admission: I have personally reviewed following labs and imaging studies -   CBC: Recent Labs  Lab 10/08/20 1330  WBC 6.9  NEUTROABS 4.9  HGB 11.8*  HCT 34.5*  MCV 103.6*  PLT 208   Basic Metabolic Panel: Recent Labs  Lab 10/08/20 1330  NA 134*  K 3.9  CL 98  CO2 25  GLUCOSE 83  BUN 7  CREATININE 0.50*  CALCIUM 9.3   GFR: Estimated Creatinine Clearance: 71.4 mL/min (A) (by C-G formula based on SCr of  0.5 mg/dL (L)). Liver Function Tests: Recent Labs  Lab 10/08/20 1330  AST 44*  ALT 27  ALKPHOS 72  BILITOT 0.4  PROT 7.7  ALBUMIN 4.0   Recent Labs  Lab 10/08/20 1330  LIPASE 106*   No results for input(s): AMMONIA in the last 168 hours. Coagulation Profile: No results for input(s): INR, PROTIME in the last 168 hours. Cardiac Enzymes: No results for input(s): CKTOTAL, CKMB, CKMBINDEX, TROPONINI in the last 168 hours. BNP (last 3 results) No results for input(s): PROBNP in the last 8760 hours. HbA1C: No results for input(s): HGBA1C in the last 72 hours. CBG: No results for input(s): GLUCAP in the last 168 hours. Lipid Profile: No results for input(s): CHOL, HDL, LDLCALC, TRIG, CHOLHDL, LDLDIRECT in the last 72 hours. Thyroid Function Tests: No results for input(s): TSH, T4TOTAL, FREET4, T3FREE, THYROIDAB in the last 72 hours. Anemia Panel: No results for input(s): VITAMINB12, FOLATE, FERRITIN, TIBC, IRON, RETICCTPCT in the last 72 hours. Urine analysis:    Component Value Date/Time   COLORURINE YELLOW (A) 10/08/2020 2215   APPEARANCEUR CLEAR (  A) 10/08/2020 2215   LABSPEC <=1.005 10/08/2020 2215   PHURINE 7.5 10/08/2020 2215   GLUCOSEU NEGATIVE 10/08/2020 2215   HGBUR NEGATIVE 10/08/2020 2215   BILIRUBINUR NEGATIVE 10/08/2020 2215   KETONESUR NEGATIVE 10/08/2020 2215   PROTEINUR NEGATIVE 10/08/2020 2215   NITRITE NEGATIVE 10/08/2020 2215   LEUKOCYTESUR NEGATIVE 10/08/2020 2215    Radiological Exams on Admission - Personally Reviewed: CT ABDOMEN PELVIS W CONTRAST  Result Date: 10/08/2020 CLINICAL DATA:  Epigastric pain EXAM: CT ABDOMEN AND PELVIS WITH CONTRAST TECHNIQUE: Multidetector CT imaging of the abdomen and pelvis was performed using the standard protocol following bolus administration of intravenous contrast. CONTRAST:  73mL OMNIPAQUE IOHEXOL 350 MG/ML SOLN COMPARISON:  CT 09/29/2020 FINDINGS: Lower chest: Lung bases demonstrate no acute consolidation or  effusion. Normal cardiac size. Hepatobiliary: Small gallstones. Hepatic steatosis. No biliary dilatation Pancreas: Generalized stranding and fluid within the mesentery consistent with pancreatitis. No ductal dilatation. Organizing fluid collection now evident between the stomach and proximal pancreas, this measures 2.8 by 2.1 cm, series 7 image 16. Spleen: Previously noted hyperenhancing focus is not as well seen today Adrenals/Urinary Tract: Adrenal glands normal. Kidneys show no hydronephrosis. Subcentimeter hypodensity left kidney too small to further characterize. The bladder is unremarkable. Stomach/Bowel: Stomach is nonenlarged. Mild rim enhancing fluid collection along the greater curvature of the stomach, measures approximately 4.8 by 1.3 cm by 3.1 cm and appears slightly decreased. No dilated small bowel. No acute bowel wall thickening. Vascular/Lymphatic: Nonaneurysmal aorta. Mild atherosclerosis. No suspicious nodes. Portal vein is patent. There is narrowed appearance of the splenic vein but without occlusion or definitive thrombus. Reproductive: Prostate is unremarkable. Other: Negative for free air. Small amount of free fluid in the pelvis and abdomen. Generalized mesenteric soft tissue stranding. Musculoskeletal: No acute or significant osseous findings. IMPRESSION: 1. Residual edema and fluid anterior to the body and tail of pancreas consistent with pancreatitis. Slight interval decrease in size of the rim enhancing fluid collection at greater curvature of stomach, but interval development of a 2.8 cm organizing fluid collection between the stomach and body of pancreas, probable developing pseudocyst. 2. Gallstones 3. Hepatic steatosis 4. Small free fluid in the pelvis and abdomen Electronically Signed   By: Jasmine Pang M.D.   On: 10/08/2020 18:13    EKG: Personally reviewed.  Rhythm is normal sinus rhythm with heart rate of 69 bpm.  No dynamic ST segment changes  appreciated.  Assessment/Plan Principal Problem:   Acute pancreatitis  Evidence of acute pancreatitis based on CT imaging complicated by what seems to be a new pseudocyst.  Previous pseudocyst seems to be resolving. Hydrating patient aggressively with intravenous isotonic fluids Keeping patient n.p.o. with exception of ice chips As needed opiate-based analgesics for substantial pain. Etiology is most likely secondary to alcohol use.  Patient admits to drinking at least 6 beers in a sitting on at least 3 nights every week.  This degree of binge drinking can increase risk of developing pancreatitis. Alternatively, patient could be suffering from gallstone pancreatitis with a trace amount of fluid seen around the gallbladder during last hospitalization.  Hepatic function panel on this presentation is unremarkable making hepatobiliary disease unlikely Lipid panel performed during last hospitalization unremarkable Urine toxicology screen pending  Active Problems:   Pseudocyst of pancreas  Supportive care, treatment of associated pancreatitis Consider gastroenterology consultation if patient fails to clinically improve    Alcohol abuse  Patient reports to binge drinking at least 3 nights weekly Counseling on cessation No clinical evidence of  withdrawal at this time however will initiate CIWA protocol and provide benzodiazepines as needed for evidence of withdrawal.    Cholelithiasis  Mention of cholelithiasis with trace amount of fluid around the gallbladder during last presentation Abdominal exam is not consistent with a cholecystitis and hepatic function panel is unremarkable on this presentation.  I feel that patient's presentation is more likely related to alcoholic than gallstone pancreatitis. Outpatient surgical follow-up if patient develops symptoms secondary to gallbladder disease  Code Status:  Full code  code status decision has been confirmed with: patient Family Communication:  deferred   Status is: Inpatient  Remains inpatient appropriate because:Ongoing active pain requiring inpatient pain management, IV treatments appropriate due to intensity of illness or inability to take PO, and Inpatient level of care appropriate due to severity of illness  Dispo: The patient is from: Home              Anticipated d/c is to: Home              Patient currently is not medically stable to d/c.   Difficult to place patient No        Marinda ElkGeorge J Adaja Wander MD [facility] Pager [phone]  If 7PM-7AM, please contact night-coverage [email or web address] Use universal [place] password for that web site. If you do not have the password, please call the hospital operator.  10/08/2020, 10:48 PM

## 2020-10-08 NOTE — ED Provider Notes (Signed)
[facility]-EMERGENCY DEPT Provider Note   CSN: [id number] Arrival date & time: 10/08/20  1223     History Chief Complaint  Patient presents with   Abdominal Pain    Nathaniel Hickman is a 55 y.o. male PMH pancreatitis who presents the emergency department for evaluation of epigastric abdominal pain.  Patient states he was recently admitted to the hospital for pancreatitis with initial etiology either alcohol versus gallstone pancreatitis.  He has had a few alcoholic beverages since leaving the hospital but states he is not a daily drinker.  He states his pain is significantly worsened and is in the epigastrium radiating to the back.  Denies chest pain, shortness of breath, fever, cough or any other systemic symptoms.  Nausea and vomiting.  Appears cachectic and and patient is worried about losing weight.   Abdominal Pain Associated symptoms: nausea and vomiting   Associated symptoms: no chest pain, no chills, no cough, no dysuria, no fever, no hematuria, no shortness of breath and no sore throat       History reviewed. No pertinent past medical history.  Patient Active Problem List   Diagnosis Date Noted   Acute pancreatitis 09/29/2020    History reviewed. No pertinent surgical history.     History reviewed. No pertinent family history.  Social History   Tobacco Use   Smoking status: Never   Smokeless tobacco: Never  Substance Use Topics   Drug use: Not Currently    Home Medications Prior to Admission medications   Medication Sig Start Date End Date Taking? Authorizing Provider  aluminum-magnesium hydroxide 200-200 MG/5ML suspension Take 10 mLs by mouth every 6 (six) hours as needed for indigestion.    [provider]    Allergies    Patient has no known allergies.  Review of Systems   Review of Systems  Constitutional:  Negative for chills and fever.  HENT:  Negative for ear pain and sore throat.   Eyes:  Negative for pain and  visual disturbance.  Respiratory:  Negative for cough and shortness of breath.   Cardiovascular:  Negative for chest pain and palpitations.  Gastrointestinal:  Positive for abdominal pain, nausea and vomiting.  Genitourinary:  Negative for dysuria and hematuria.  Musculoskeletal:  Negative for arthralgias and back pain.  Skin:  Negative for color change and rash.  Neurological:  Negative for seizures and syncope.  All other systems reviewed and are negative.  Physical Exam Updated Vital Signs BP (!) 118/97 (BP Location: Left Arm)   Pulse 85   Temp 98.6 F (37 C) (Oral)   Resp 18   SpO2 100%   Physical Exam Vitals and nursing note reviewed.  Constitutional:      Appearance: He is well-developed.  HENT:     Head: Normocephalic and atraumatic.  Eyes:     Conjunctiva/sclera: Conjunctivae normal.  Cardiovascular:     Rate and Rhythm: Normal rate and regular rhythm.     Heart sounds: No murmur heard. Pulmonary:     Effort: Pulmonary effort is normal. No respiratory distress.     Breath sounds: Normal breath sounds.  Abdominal:     Palpations: Abdomen is soft.     Tenderness: There is abdominal tenderness in the epigastric area.  Musculoskeletal:     Cervical back: Neck supple.  Skin:    General: Skin is warm and dry.  Neurological:     Mental Status: He is alert.    ED Results / Procedures / Treatments  Labs (all labs ordered are listed, but only abnormal results are displayed) Labs Reviewed  CBC WITH DIFFERENTIAL/PLATELET - Abnormal; Notable for the following components:      Result Value   RBC 3.33 (*)    Hemoglobin 11.8 (*)    HCT 34.5 (*)    MCV 103.6 (*)    MCH 35.4 (*)    All other components within normal limits  COMPREHENSIVE METABOLIC PANEL - Abnormal; Notable for the following components:   Sodium 134 (*)    Creatinine, Ser 0.50 (*)    AST 44 (*)    All other components within normal limits  LIPASE, BLOOD - Abnormal; Notable for the following  components:   Lipase 106 (*)    All other components within normal limits  URINALYSIS, ROUTINE W REFLEX MICROSCOPIC    EKG EKG Interpretation  Date/Time:  Sunday October 08 2020 12:59:04 EDT Ventricular Rate:  69 PR Interval:  151 QRS Duration: 85 QT Interval:  383 QTC Calculation: 411 R Axis:   55 Text Interpretation: Sinus rhythm Abnormal R-wave progression, early transition Confirmed by Ernie Avena ([id number]) on 10/08/2020 2:24:07 PM  Radiology CT ABDOMEN PELVIS W CONTRAST  Result Date: 10/08/2020 CLINICAL DATA:  Epigastric pain EXAM: CT ABDOMEN AND PELVIS WITH CONTRAST TECHNIQUE: Multidetector CT imaging of the abdomen and pelvis was performed using the standard protocol following bolus administration of intravenous contrast. CONTRAST:  44mL OMNIPAQUE IOHEXOL 350 MG/ML SOLN COMPARISON:  CT 09/29/2020 FINDINGS: Lower chest: Lung bases demonstrate no acute consolidation or effusion. Normal cardiac size. Hepatobiliary: Small gallstones. Hepatic steatosis. No biliary dilatation Pancreas: Generalized stranding and fluid within the mesentery consistent with pancreatitis. No ductal dilatation. Organizing fluid collection now evident between the stomach and proximal pancreas, this measures 2.8 by 2.1 cm, series 7 image 16. Spleen: Previously noted hyperenhancing focus is not as well seen today Adrenals/Urinary Tract: Adrenal glands normal. Kidneys show no hydronephrosis. Subcentimeter hypodensity left kidney too small to further characterize. The bladder is unremarkable. Stomach/Bowel: Stomach is nonenlarged. Mild rim enhancing fluid collection along the greater curvature of the stomach, measures approximately 4.8 by 1.3 cm by 3.1 cm and appears slightly decreased. No dilated small bowel. No acute bowel wall thickening. Vascular/Lymphatic: Nonaneurysmal aorta. Mild atherosclerosis. No suspicious nodes. Portal vein is patent. There is narrowed appearance of the splenic vein but without occlusion or  definitive thrombus. Reproductive: Prostate is unremarkable. Other: Negative for free air. Small amount of free fluid in the pelvis and abdomen. Generalized mesenteric soft tissue stranding. Musculoskeletal: No acute or significant osseous findings. IMPRESSION: 1. Residual edema and fluid anterior to the body and tail of pancreas consistent with pancreatitis. Slight interval decrease in size of the rim enhancing fluid collection at greater curvature of stomach, but interval development of a 2.8 cm organizing fluid collection between the stomach and body of pancreas, probable developing pseudocyst. 2. Gallstones 3. Hepatic steatosis 4. Small free fluid in the pelvis and abdomen Electronically Signed   By: Jasmine Pang M.D.   On: 10/08/2020 18:13    Procedures Procedures   Medications Ordered in ED Medications  iohexol (OMNIPAQUE) 350 MG/ML injection 80 mL (80 mLs Intravenous Contrast Given 10/08/20 1721)    ED Course  I have reviewed the triage vital signs and the nursing notes.  Pertinent labs & imaging results that were available during my care of the patient were reviewed by me and considered in my medical decision making (see chart for details).    MDM Rules/Calculators/A&P  Patient seen the emergency department for evaluation of abdominal pain nausea and vomiting.  Physical exam reveals a cachectic ill-appearing patient with tenderness to the epigastrium.  CBC unremarkable, CMP unremarkable, lipase elevated to 106.  CT abdomen pelvis with evidence of developing pseudocyst and acute pancreatitis.  Patient started on aggressive fluid rehydration and pain controlled with morphine.  Patient require admission for acute pancreatitis. Final Clinical Impression(s) / ED Diagnoses Final diagnoses:  None    Rx / DC Orders ED Discharge Orders     None        Jamala Kohen, Wyn Forster, MD 10/08/20 [phone]

## 2020-10-08 NOTE — ED Notes (Signed)
Called x1, no response. Not visualized in lobby or outside.

## 2020-10-09 LAB — CBC WITH DIFFERENTIAL/PLATELET
Abs Immature Granulocytes: 0.01 10*3/uL (ref 0.00–0.07)
Basophils Absolute: 0 10*3/uL (ref 0.0–0.1)
Basophils Relative: 1 %
Eosinophils Absolute: 0.1 10*3/uL (ref 0.0–0.5)
Eosinophils Relative: 3 %
HCT: 33.6 % — ABNORMAL LOW (ref 39.0–52.0)
Hemoglobin: 11.6 g/dL — ABNORMAL LOW (ref 13.0–17.0)
Immature Granulocytes: 0 %
Lymphocytes Relative: 28 %
Lymphs Abs: 1 10*3/uL (ref 0.7–4.0)
MCH: 35.6 pg — ABNORMAL HIGH (ref 26.0–34.0)
MCHC: 34.5 g/dL (ref 30.0–36.0)
MCV: 103.1 fL — ABNORMAL HIGH (ref 80.0–100.0)
Monocytes Absolute: 0.4 10*3/uL (ref 0.1–1.0)
Monocytes Relative: 10 %
Neutro Abs: 2.2 10*3/uL (ref 1.7–7.7)
Neutrophils Relative %: 58 %
Platelets: 168 10*3/uL (ref 150–400)
RBC: 3.26 MIL/uL — ABNORMAL LOW (ref 4.22–5.81)
RDW: 12.8 % (ref 11.5–15.5)
WBC: 3.7 10*3/uL — ABNORMAL LOW (ref 4.0–10.5)
nRBC: 0 % (ref 0.0–0.2)

## 2020-10-09 LAB — COMPREHENSIVE METABOLIC PANEL
ALT: 22 U/L (ref 0–44)
AST: 30 U/L (ref 15–41)
Albumin: 3.3 g/dL — ABNORMAL LOW (ref 3.5–5.0)
Alkaline Phosphatase: 67 U/L (ref 38–126)
Anion gap: 11 (ref 5–15)
BUN: 6 mg/dL (ref 6–20)
CO2: 26 mmol/L (ref 22–32)
Calcium: 9.5 mg/dL (ref 8.9–10.3)
Chloride: 98 mmol/L (ref 98–111)
Creatinine, Ser: 0.59 mg/dL — ABNORMAL LOW (ref 0.61–1.24)
GFR, Estimated: >60 mL/min (ref >60)
Glucose, Bld: 77 mg/dL (ref 70–99)
Potassium: 3.7 mmol/L (ref 3.5–5.1)
Sodium: 135 mmol/L (ref 135–145)
Total Bilirubin: 1.1 mg/dL (ref 0.3–1.2)
Total Protein: 7 g/dL (ref 6.5–8.1)

## 2020-10-09 LAB — RAPID URINE DRUG SCREEN, HOSP PERFORMED
Amphetamines: NOT DETECTED
Barbiturates: NOT DETECTED
Benzodiazepines: NOT DETECTED
Cocaine: NOT DETECTED
Opiates: POSITIVE — AB
Tetrahydrocannabinol: POSITIVE — AB

## 2020-10-09 LAB — MAGNESIUM: Magnesium: 1.8 mg/dL (ref 1.7–2.4)

## 2020-10-09 LAB — PHOSPHORUS: Phosphorus: 3.8 mg/dL (ref 2.5–4.6)

## 2020-10-09 LAB — ETHANOL: Alcohol, Ethyl (B): <10 mg/dL (ref <10)

## 2020-10-09 NOTE — Progress Notes (Signed)
PROGRESS NOTE    Nathaniel Hickman  FSF:[id number] DOB: 18-Mar-1965 DOA: 10/08/2020 PCP: Pcp, No   Brief Narrative: 55 year old male with past medical history of recent diagnosis of pancreatitis who presents to [facility] emergency department with complaints of abdominal pain.  Of note, patient was admitted to [facility] to the internal medicine residency service from 9/2 until 9/3.  For pancreatitis which was managed per usual care.    There was discussion as to whether patient's pancreatitis was due to alcohol use or gallbladder pancreatitis.  Patient was advised to cease alcohol use and was advised to follow-up with general surgery after discharge.  Patient was discharged home on 9/3.   Patient explains that he felt well for approximately 3 to 4 days after discharge but following that the patient began to develop recurrent epigastric and left upper quadrant pain.  Patient describes this pain as severe in intensity, sharp in quality and radiating to the mid back.  Pain is worse with movement or ingestion of foods.  Pain is improved with rest.  Patient attempted to take ibuprofen and BC powder on multiple occasions to control his pain with no improvement in symptoms.   Patient denies associated diarrhea, nausea, vomiting, recent ingestion of undercooked food, sick contacts or recent contact with COVID-19 infection.   Upon further questioning, patient states that he continues to drink somewhat regularly.  Patient states that he typically drinks at least 6 beers in 1 sitting on at least 3 nights a week.  Since patient's discharge however he has continued to drink alcohol but states that it has been less.   Patient's pain continued to worsen until EMS was contacted who promptly brought the patient into [facility] emergency department for evaluation.   Upon evaluation in the emergency department patient was found to have a slightly elevated lipase of 106, down from 315 during  his last hospitalization.  CT imaging of the abdomen pelvis revealed residual edema and fluid anterior to the body and tail of the pancreas concerning for ongoing pancreatitis with slight interval decrease in the size of a rim-enhancing fluid collection near the greater curvature of the stomach.  However, an interval development of the 2.8 cm organizing fluid collection between the stomach and body of the pancreas has now developed concerning for an additional pseudocyst.  Patient was provided 4 mg of intravenous morphine for substantial abdominal pain.  Patient initiated on intravenous fluids.  The hospitalist group was then called to assess the patient for admission to the hospital  Assessment & Plan:   Principal Problem:   Acute pancreatitis Active Problems:   Pseudocyst of pancreas   Alcohol abuse   Cholelithiasis  #1 recurrent acute pancreatitis patient was recently admitted to the hospital earlier this month with the same diagnosis.  This was thought to be related to alcohol ingestion.  CT scan of the abdomen this admission concerning for a new pseudocyst.  When I saw him today he thought he was ready to have some food his pain was decreased I offered him clear liquid diet and the staff reported his pain increased after drinking clear liquid diet.  Patient admits to drinking 6 beers in 1 sitting at least 3 nights every week. Urine drug screen positive for opiates and THC. Alcohol level pending Lipid panel unremarkable. Continue pain control Advance diet as tolerated Start PPI  #2  Cholelithiasis gallbladder ultrasound shows gallstones with trace amount of fluid around the gallbladder.  #3 alcohol abuse -CIWA  protocol   Estimated body mass index is 17.36 kg/m as calculated from the following:   Height as of this encounter: 5\' 5"  (1.651 m).   Weight as of this encounter: 47.3 kg.  DVT prophylaxis: lovenox Code Status:full Family Communication:none Disposition Plan:  Status is:  Inpatient  Remains inpatient appropriate because:IV treatments appropriate due to intensity of illness or inability to take PO  Dispo: The patient is from: Home              Anticipated d/c is to: Home              Patient currently is not medically stable to d/c.   Difficult to place patient No    Consultants:  none  Procedures: none Antimicrobials: none  Subjective: C/o pain with FLD  Objective: Vitals:   10/09/20 0214 10/09/20 0558 10/09/20 0933 10/09/20 1132  BP: (!) 132/95 (!) 118/91 116/80 111/82  Pulse: 72 78 61 (!) 58  Resp: 20 16 16 16   Temp: 97.7 F (36.5 C) 98.2 F (36.8 C) 98 F (36.7 C) 98.4 F (36.9 C)  TempSrc: Oral Oral Oral Oral  SpO2: 100% 99% 98% 100%  Weight:      Height:        Intake/Output Summary (Last 24 hours) at 10/09/2020 1438 Last data filed at 10/09/2020 1250 Gross per 24 hour  Intake 1656.23 ml  Output 550 ml  Net 1106.23 ml   Filed Weights   10/08/20 1953  Weight: 47.3 kg    Examination:  General exam: Appears calm and comfortable  Respiratory system: Clear to auscultation. Respiratory effort normal. Cardiovascular system: S1 & S2 heard, RRR. No JVD, murmurs, rubs, gallops or clicks. No pedal edema. Gastrointestinal system: Abdomen is nondistended, soft and tender. No organomegaly or masses felt. Normal bowel sounds heard. Central nervous system: Alert and oriented. No focal neurological deficits. Extremities: Symmetric 5 x 5 power. Skin: No rashes, lesions or ulcers Psychiatry: Judgement and insight appear normal. Mood & affect appropriate.     Data Reviewed: I have personally reviewed following labs and imaging studies  CBC: Recent Labs  Lab 10/08/20 1330 10/09/20 0415  WBC 6.9 3.7*  NEUTROABS 4.9 2.2  HGB 11.8* 11.6*  HCT 34.5* 33.6*  MCV 103.6* 103.1*  PLT 208 168   Basic Metabolic Panel: Recent Labs  Lab 10/08/20 1330 10/09/20 0415  NA 134* 135  K 3.9 3.7  CL 98 98  CO2 25 26  GLUCOSE 83 77  BUN 7  6  CREATININE 0.50* 0.59*  CALCIUM 9.3 9.5  MG  --  1.8  PHOS  --  3.8   GFR: Estimated Creatinine Clearance: 71.4 mL/min (A) (by C-G formula based on SCr of 0.59 mg/dL (L)). Liver Function Tests: Recent Labs  Lab 10/08/20 1330 10/09/20 0415  AST 44* 30  ALT 27 22  ALKPHOS 72 67  BILITOT 0.4 1.1  PROT 7.7 7.0  ALBUMIN 4.0 3.3*   Recent Labs  Lab 10/08/20 1330  LIPASE 106*   No results for input(s): AMMONIA in the last 168 hours. Coagulation Profile: No results for input(s): INR, PROTIME in the last 168 hours. Cardiac Enzymes: No results for input(s): CKTOTAL, CKMB, CKMBINDEX, TROPONINI in the last 168 hours. BNP (last 3 results) No results for input(s): PROBNP in the last 8760 hours. HbA1C: No results for input(s): HGBA1C in the last 72 hours. CBG: No results for input(s): GLUCAP in the last 168 hours. Lipid Profile: No results for input(s): CHOL, HDL,  LDLCALC, TRIG, CHOLHDL, LDLDIRECT in the last 72 hours. Thyroid Function Tests: No results for input(s): TSH, T4TOTAL, FREET4, T3FREE, THYROIDAB in the last 72 hours. Anemia Panel: No results for input(s): VITAMINB12, FOLATE, FERRITIN, TIBC, IRON, RETICCTPCT in the last 72 hours. Sepsis Labs: No results for input(s): PROCALCITON, LATICACIDVEN in the last 168 hours.  Recent Results (from the past 240 hour(s))  Resp Panel by RT-PCR (Flu A&B, Covid) Nasopharyngeal Swab     Status: None   Collection Time: 10/08/20  9:25 PM   Specimen: Nasopharyngeal Swab; Nasopharyngeal(NP) swabs in vial transport medium  Result Value Ref Range Status   SARS Coronavirus 2 by RT PCR NEGATIVE NEGATIVE Final    Comment: (NOTE) SARS-CoV-2 target nucleic acids are NOT DETECTED.  The SARS-CoV-2 RNA is generally detectable in upper respiratory specimens during the acute phase of infection. The lowest concentration of SARS-CoV-2 viral copies this assay can detect is 138 copies/mL. A negative result does not preclude SARS-Cov-2 infection  and should not be used as the sole basis for treatment or other patient management decisions. A negative result may occur with  improper specimen collection/handling, submission of specimen other than nasopharyngeal swab, presence of viral mutation(s) within the areas targeted by this assay, and inadequate number of viral copies(<138 copies/mL). A negative result must be combined with clinical observations, patient history, and epidemiological information. The expected result is Negative.  Fact Sheet for Patients:  BloggerCourse.com  Fact Sheet for Healthcare Providers:  SeriousBroker.it  This test is no t yet approved or cleared by the [place] FDA and  has been authorized for detection and/or diagnosis of SARS-CoV-2 by FDA under an Emergency Use Authorization (EUA). This EUA will remain  in effect (meaning this test can be used) for the duration of the COVID-19 declaration under Section 564(b)(1) of the Act, 21 U.S.C.section 360bbb-3(b)(1), unless the authorization is terminated  or revoked sooner.       Influenza A by PCR NEGATIVE NEGATIVE Final   Influenza B by PCR NEGATIVE NEGATIVE Final    Comment: (NOTE) The Xpert Xpress SARS-CoV-2/FLU/RSV plus assay is intended as an aid in the diagnosis of influenza from Nasopharyngeal swab specimens and should not be used as a sole basis for treatment. Nasal washings and aspirates are unacceptable for Xpert Xpress SARS-CoV-2/FLU/RSV testing.  Fact Sheet for Patients: BloggerCourse.com  Fact Sheet for Healthcare Providers: SeriousBroker.it  This test is not yet approved or cleared by the [place] FDA and has been authorized for detection and/or diagnosis of SARS-CoV-2 by FDA under an Emergency Use Authorization (EUA). This EUA will remain in effect (meaning this test can be used) for the duration of the COVID-19 declaration  under Section 564(b)(1) of the Act, 21 U.S.C. section 360bbb-3(b)(1), unless the authorization is terminated or revoked.  Performed at [facility], [address], [place], [place] [address]          Radiology Studies: CT ABDOMEN PELVIS W CONTRAST  Result Date: 10/08/2020 CLINICAL DATA:  Epigastric pain EXAM: CT ABDOMEN AND PELVIS WITH CONTRAST TECHNIQUE: Multidetector CT imaging of the abdomen and pelvis was performed using the standard protocol following bolus administration of intravenous contrast. CONTRAST:  56mL OMNIPAQUE IOHEXOL 350 MG/ML SOLN COMPARISON:  CT 09/29/2020 FINDINGS: Lower chest: Lung bases demonstrate no acute consolidation or effusion. Normal cardiac size. Hepatobiliary: Small gallstones. Hepatic steatosis. No biliary dilatation Pancreas: Generalized stranding and fluid within the mesentery consistent with pancreatitis. No ductal dilatation. Organizing fluid collection now evident between the stomach and  proximal pancreas, this measures 2.8 by 2.1 cm, series 7 image 16. Spleen: Previously noted hyperenhancing focus is not as well seen today Adrenals/Urinary Tract: Adrenal glands normal. Kidneys show no hydronephrosis. Subcentimeter hypodensity left kidney too small to further characterize. The bladder is unremarkable. Stomach/Bowel: Stomach is nonenlarged. Mild rim enhancing fluid collection along the greater curvature of the stomach, measures approximately 4.8 by 1.3 cm by 3.1 cm and appears slightly decreased. No dilated small bowel. No acute bowel wall thickening. Vascular/Lymphatic: Nonaneurysmal aorta. Mild atherosclerosis. No suspicious nodes. Portal vein is patent. There is narrowed appearance of the splenic vein but without occlusion or definitive thrombus. Reproductive: Prostate is unremarkable. Other: Negative for free air. Small amount of free fluid in the pelvis and abdomen. Generalized mesenteric soft tissue stranding. Musculoskeletal: No acute or  significant osseous findings. IMPRESSION: 1. Residual edema and fluid anterior to the body and tail of pancreas consistent with pancreatitis. Slight interval decrease in size of the rim enhancing fluid collection at greater curvature of stomach, but interval development of a 2.8 cm organizing fluid collection between the stomach and body of pancreas, probable developing pseudocyst. 2. Gallstones 3. Hepatic steatosis 4. Small free fluid in the pelvis and abdomen Electronically Signed   By: Jasmine Pang M.D.   On: 10/08/2020 18:13        Scheduled Meds:  enoxaparin (LOVENOX) injection  40 mg Subcutaneous Q24H   folic acid  1 mg Oral Daily   multivitamin with minerals  1 tablet Oral Daily   pantoprazole (PROTONIX) IV  40 mg Intravenous Q24H   thiamine  100 mg Oral Daily   Or   thiamine  100 mg Intravenous Daily   Continuous Infusions:  lactated ringers 125 mL/hr at 10/09/20 1333     LOS: 1 day    Time spent: 9 MIN    Alwyn Ren, MD 10/09/2020, 2:38 PM

## 2020-10-09 NOTE — Discharge Instructions (Signed)
Website for reasonable cost of meds-[email or web address]

## 2020-10-09 NOTE — TOC Initial Note (Signed)
Transition of Care [facility]) - Initial/Assessment Note    Patient Details  Name: Nathaniel Hickman MRN: [id number] Date of Birth: 1965-06-07  [facility]) CM/SW Contact:    Lanier Clam, RN Phone Number: 10/09/2020, 10:16 AM  Clinical Narrative: Spoke to patient about referral for substance abuse-etoh-patient states he declines resource he has it managed, will need  pcp listing(patient will make own appt),& will provide health insurance list-patient works-does not qualify for med asst. Can provide own transport home.                  Expected Discharge Plan: Home/Self Care Barriers to Discharge: Continued Medical Work up   Patient Goals and CMS Choice Patient states their goals for this hospitalization and ongoing recovery are:: go home CMS Medicare.gov Compare Post Acute Care list provided to:: Patient Choice offered to / list presented to : Patient  Expected Discharge Plan and Services Expected Discharge Plan: Home/Self Care   Discharge Planning Services: CM Consult Post Acute Care Choice: NA Living arrangements for the past 2 months: Apartment                                      Prior Living Arrangements/Services Living arrangements for the past 2 months: Apartment Lives with:: Self Patient language and need for interpreter reviewed:: Yes Do you feel safe going back to the place where you live?: Yes      Need for Family Participation in Patient Care: No (Comment) Care giver support system in place?: Yes (comment)   Criminal Activity/Legal Involvement Pertinent to Current Situation/Hospitalization: No - Comment as needed  Activities of Daily Living Home Assistive Devices/Equipment: None ADL Screening (condition at time of admission) Patient's cognitive ability adequate to safely complete daily activities?: Yes Is the patient deaf or have difficulty hearing?: No Does the patient have difficulty seeing, even when wearing glasses/contacts?:  No Does the patient have difficulty concentrating, remembering, or making decisions?: No Patient able to express need for assistance with ADLs?: Yes Does the patient have difficulty dressing or bathing?: No Independently performs ADLs?: Yes (appropriate for developmental age) Does the patient have difficulty walking or climbing stairs?: Yes Weakness of Legs: None Weakness of Arms/Hands: None  Permission Sought/Granted Permission sought to share information with : Case Manager Permission granted to share information with : Yes, Verbal Permission Granted  Share Information with NAME: Case Manager           Emotional Assessment Appearance:: Appears stated age Attitude/Demeanor/Rapport: Gracious Affect (typically observed): Accepting Orientation: : Oriented to Self, Oriented to Place, Oriented to  Time, Oriented to Situation Alcohol / Substance Use: Alcohol Use Psych Involvement: No (comment)  Admission diagnosis:  Acute pancreatitis [K85.90] Patient Active Problem List   Diagnosis Date Noted   Pseudocyst of pancreas 10/08/2020   Alcohol abuse 10/08/2020   Cholelithiasis 10/08/2020   Acute pancreatitis 09/29/2020   PCP:  Pcp, No Pharmacy:   [facility] [phone] - [place], [place] - [phone] AT [facility] [address] & [address] [place] [address] Phone: [phone] Fax: [phone]     Social Determinants of Health (SDOH) Interventions    Readmission Risk Interventions No flowsheet data found.

## 2020-10-09 NOTE — Plan of Care (Signed)

## 2020-10-09 NOTE — Plan of Care (Signed)
  Problem: Education: Goal: Knowledge of General Education information will improve Description: Including pain rating scale, medication(s)/side effects and non-pharmacologic comfort measures Outcome: Progressing   Problem: Education: Goal: Knowledge of General Education information will improve Description: Including pain rating scale, medication(s)/side effects and non-pharmacologic comfort measures Outcome: Progressing   Problem: Clinical Measurements: Goal: Ability to maintain clinical measurements within normal limits will improve Outcome: Progressing Goal: Will remain free from infection Outcome: Progressing Goal: Diagnostic test results will improve Outcome: Progressing Goal: Respiratory complications will improve Outcome: Progressing Goal: Cardiovascular complication will be avoided Outcome: Progressing   

## 2020-10-10 LAB — COMPREHENSIVE METABOLIC PANEL
ALT: 19 U/L (ref 0–44)
AST: 26 U/L (ref 15–41)
Albumin: 3.5 g/dL (ref 3.5–5.0)
Alkaline Phosphatase: 74 U/L (ref 38–126)
Anion gap: 11 (ref 5–15)
BUN: <5 mg/dL — ABNORMAL LOW (ref 6–20)
CO2: 27 mmol/L (ref 22–32)
Calcium: 9.9 mg/dL (ref 8.9–10.3)
Chloride: 101 mmol/L (ref 98–111)
Creatinine, Ser: 0.67 mg/dL (ref 0.61–1.24)
GFR, Estimated: >60 mL/min (ref >60)
Glucose, Bld: 88 mg/dL (ref 70–99)
Potassium: 3.5 mmol/L (ref 3.5–5.1)
Sodium: 139 mmol/L (ref 135–145)
Total Bilirubin: 1.2 mg/dL (ref 0.3–1.2)
Total Protein: 7.4 g/dL (ref 6.5–8.1)

## 2020-10-10 LAB — CBC
HCT: 34.9 % — ABNORMAL LOW (ref 39.0–52.0)
Hemoglobin: 12.1 g/dL — ABNORMAL LOW (ref 13.0–17.0)
MCH: 36 pg — ABNORMAL HIGH (ref 26.0–34.0)
MCHC: 34.7 g/dL (ref 30.0–36.0)
MCV: 103.9 fL — ABNORMAL HIGH (ref 80.0–100.0)
Platelets: 189 10*3/uL (ref 150–400)
RBC: 3.36 MIL/uL — ABNORMAL LOW (ref 4.22–5.81)
RDW: 12.5 % (ref 11.5–15.5)
WBC: 5.2 10*3/uL (ref 4.0–10.5)
nRBC: 0 % (ref 0.0–0.2)

## 2020-10-10 LAB — LIPASE, BLOOD: Lipase: 148 U/L — ABNORMAL HIGH (ref 11–51)

## 2020-10-10 MED ORDER — BOOST / RESOURCE BREEZE PO LIQD CUSTOM
1.0000 | Freq: Two times a day (BID) | ORAL | Status: DC
  Administered 2020-10-10 – 2020-10-12 (×3): 1 via ORAL

## 2020-10-10 MED ORDER — TRAMADOL HCL 50 MG PO TABS
50.0000 mg | ORAL_TABLET | Freq: Four times a day (QID) | ORAL | Status: DC | PRN
  Administered 2020-10-10 – 2020-10-11 (×3): 50 mg via ORAL
  Filled 2020-10-10 (×4): qty 1

## 2020-10-10 MED ORDER — PROSOURCE PLUS PO LIQD
30.0000 mL | Freq: Two times a day (BID) | ORAL | Status: DC
  Administered 2020-10-10 – 2020-10-11 (×3): 30 mL via ORAL
  Filled 2020-10-10 (×3): qty 30

## 2020-10-10 NOTE — Progress Notes (Signed)
Initial Nutrition Assessment  DOCUMENTATION CODES:   Severe malnutrition in context of acute illness/injury, Underweight  INTERVENTION:  - will order Boost Breeze BID, each supplement provides 250 kcal and 9 grams of protein. - will order 30 ml Prosource Plus BID, each supplement provides 100 kcal and 15 grams protein.  - secure chat message sent to MD to ask if there is a role for creon for this patient.    NUTRITION DIAGNOSIS:   Severe Malnutrition related to acute illness as evidenced by moderate fat depletion, moderate muscle depletion.  GOAL:   Patient will meet greater than or equal to 90% of their needs  MONITOR:   PO intake, Supplement acceptance, Labs, Weight trends  REASON FOR ASSESSMENT:   Malnutrition Screening Tool    ASSESSMENT:   55 year old male with medical history of recent diagnosis of pancreatitis, no other known history. He was admitted at [facility] 9/6-9/7 for pancreatitis and presented to the [facility] ED d/t ongoing abdominal pain. He had felt well for 3-4 days after d/c but pain is now severe, sharp, and radiates to mid-back. He reported that pain is worse with movement and eating. Pain improves during rest.  Diet advanced to CLD yesterday at 0757 and to Soft today at 0834. Patient's lunch tray in the room and he had eaten all but 3-4 bites of the meal (~618 kcal and 27 grams protein).   Patient reports ongoing abdominal that has not worsened after eating lunch; had been worsening with PO intakes PTA. He has experienced nausea d/t the pain but has not had any episodes of vomiting.  Abdominal pain has been ongoing for 2-3 weeks and did improve after brief hospitalization at [facility], but then worsened as of the time of admission to [facility].   Patient is concerned about weight loss and inability to gain weight during this time.   Limited information able to be obtained from patient at this time as patient asked several questions outside of RD's scope  and RD encouraged him to talk with MD about these questions.  Weight on 9/11 was 104 lb and PTA the most recently documented weight was on 07/29/19 when he weighed 130 lb. This indicates 26 lb weight loss (20% body weight) in the past 14 months; not significant for time frame, but unsure if weight loss has occurred more acutely.    Labs reviewed; BUN: <5 mg/dl, lipase: 825 u/l (up from 9/12). Medications reviewed; 1 mg folvite/day, 1 tablet multivitamin with minerals/day, 40 mg IV protonix/day, 100 mg thiamine/day.      NUTRITION - FOCUSED PHYSICAL EXAM:  Flowsheet Row Most Recent Value  Orbital Region Moderate depletion  Upper Arm Region Moderate depletion  Thoracic and Lumbar Region Unable to assess  Buccal Region Moderate depletion  Temple Region Moderate depletion  Clavicle Bone Region Moderate depletion  Clavicle and Acromion Bone Region Moderate depletion  Scapular Bone Region Unable to assess  Dorsal Hand Mild depletion  Patellar Region Unable to assess  Anterior Thigh Region Unable to assess  Posterior Calf Region Unable to assess  Edema (RD Assessment) Unable to assess  Hair Reviewed  Eyes Reviewed  Mouth Reviewed  [missing several front teeth]  Skin Reviewed  Nails Reviewed       Diet Order:   Diet Order             DIET SOFT Room service appropriate? Yes; Fluid consistency: Thin  Diet effective now  EDUCATION NEEDS:   Not appropriate for education at this time  Skin:  Skin Assessment: Reviewed RN Assessment  Last BM:  9/12 (per patient report)  Height:   Ht Readings from Last 1 Encounters:  10/08/20 5\' 5"  (1.651 m)    Weight:   Wt Readings from Last 1 Encounters:  10/08/20 47.3 kg     Estimated Nutritional Needs:  Kcal:  1900-2100 kcal Protein:  90-105 grams Fluid:  >/= 2.2 L/day     12/08/20, MS, RD, LDN, CNSC Inpatient Clinical Dietitian RD pager # available in [facility]  After hours/weekend pager #  available in [facility]

## 2020-10-10 NOTE — Progress Notes (Signed)
PROGRESS NOTE    Kohle Winner  QIO:[id number] DOB: November 17, 1965 DOA: 10/08/2020 PCP: Pcp, No   Brief Narrative: 55 year old male with past medical history of recent diagnosis of pancreatitis who presents to [facility] emergency department with complaints of abdominal pain.  Of note, patient was admitted to [facility] to the internal medicine residency service from 9/2 until 9/3.  For pancreatitis which was managed per usual care.    There was discussion as to whether patient's pancreatitis was due to alcohol use or gallbladder pancreatitis.  Patient was advised to cease alcohol use and was advised to follow-up with general surgery after discharge.  Patient was discharged home on 9/3.   Patient explains that he felt well for approximately 3 to 4 days after discharge but following that the patient began to develop recurrent epigastric and left upper quadrant pain.  Patient describes this pain as severe in intensity, sharp in quality and radiating to the mid back.  Pain is worse with movement or ingestion of foods.  Pain is improved with rest.  Patient attempted to take ibuprofen and BC powder on multiple occasions to control his pain with no improvement in symptoms.   Patient denies associated diarrhea, nausea, vomiting, recent ingestion of undercooked food, sick contacts or recent contact with COVID-19 infection.   Upon further questioning, patient states that he continues to drink somewhat regularly.  Patient states that he typically drinks at least 6 beers in 1 sitting on at least 3 nights a week.  Since patient's discharge however he has continued to drink alcohol but states that it has been less.   Patient's pain continued to worsen until EMS was contacted who promptly brought the patient into [facility] emergency department for evaluation.   Upon evaluation in the emergency department patient was found to have a slightly elevated lipase of 106, down from 315 during  his last hospitalization.  CT imaging of the abdomen pelvis revealed residual edema and fluid anterior to the body and tail of the pancreas concerning for ongoing pancreatitis with slight interval decrease in the size of a rim-enhancing fluid collection near the greater curvature of the stomach.  However, an interval development of the 2.8 cm organizing fluid collection between the stomach and body of the pancreas has now developed concerning for an additional pseudocyst.  Patient was provided 4 mg of intravenous morphine for substantial abdominal pain.  Patient initiated on intravenous fluids.  The hospitalist group was then called to assess the patient for admission to the hospital  Assessment & Plan:   Principal Problem:   Acute pancreatitis Active Problems:   Pseudocyst of pancreas   Alcohol abuse   Cholelithiasis  #1 recurrent acute pancreatitis patient was recently admitted to the hospital earlier this month with the same diagnosis.  This was thought to be related to alcohol ingestion.   CT scan of the abdomen this admission concerning for a new pseudocyst.   He was unable to tolerate soft diet today.  However he was requesting both morphine and food.  I will DC morphine and start him on tramadol and keep him on soft diet.  Recheck labs in AM.  If he is stable in the morning consider discharge.  I have discussed with him multiple times about quitting alcohol intake. Patient admits to drinking 6 beers in 1 sitting at least 3 nights every week. Urine drug screen positive for opiates and THC. Alcohol level less than 10 on 10/09/2020  lipid panel unremarkable.  pain control with tramadol Advance diet as tolerated  #2  Cholelithiasis gallbladder ultrasound shows gallstones with trace amount of fluid around the gallbladder.  #3 alcohol abuse -CIWA protocol   Estimated body mass index is 17.36 kg/m as calculated from the following:   Height as of this encounter: 5\' 5"  (1.651 m).   Weight  as of this encounter: 47.3 kg.  DVT prophylaxis: lovenox Code Status:full Family Communication:none Disposition Plan:  Status is: Inpatient  Remains inpatient appropriate because:IV treatments appropriate due to intensity of illness or inability to take PO  Dispo: The patient is from: Home              Anticipated d/c is to: Home              Patient currently is not medically stable to d/c.   Difficult to place patient No    Consultants:  none  Procedures: none Antimicrobials: none  Subjective:  Complains of pain with food this afternoon requesting morphine And I told him that I will switch him to clear liquid diet he requested a soft diet   Objective: Vitals:   10/09/20 1132 10/09/20 2019 10/10/20 0449 10/10/20 1206  BP: 111/82 125/90 (!) 135/93 (!) 137/98  Pulse: (!) 58 63 (!) 57 81  Resp: 16 18 18 16   Temp: 98.4 F (36.9 C) 98.2 F (36.8 C) 98.5 F (36.9 C) 99 F (37.2 C)  TempSrc: Oral  Oral Oral  SpO2: 100% 100% 100% 100%  Weight:      Height:        Intake/Output Summary (Last 24 hours) at 10/10/2020 1506 Last data filed at 10/10/2020 0930 Gross per 24 hour  Intake 2430.4 ml  Output 1500 ml  Net 930.4 ml    Filed Weights   10/08/20 1953  Weight: 47.3 kg    Examination:  General exam: Appears calm and comfortable  Respiratory system: Clear to auscultation. Respiratory effort normal. Cardiovascular system: S1 & S2 heard, RRR. No JVD, murmurs, rubs, gallops or clicks. No pedal edema. Gastrointestinal system: Abdomen is nondistended, soft and tender. No organomegaly or masses felt. Normal bowel sounds heard. Central nervous system: Alert and oriented. No focal neurological deficits. Extremities: Symmetric 5 x 5 power. Skin: No rashes, lesions or ulcers Psychiatry: Judgement and insight appear normal. Mood & affect appropriate.     Data Reviewed: I have personally reviewed following labs and imaging studies  CBC: Recent Labs  Lab  10/08/20 1330 10/09/20 0415 10/10/20 0729  WBC 6.9 3.7* 5.2  NEUTROABS 4.9 2.2  --   HGB 11.8* 11.6* 12.1*  HCT 34.5* 33.6* 34.9*  MCV 103.6* 103.1* 103.9*  PLT 208 168 189    Basic Metabolic Panel: Recent Labs  Lab 10/08/20 1330 10/09/20 0415 10/10/20 0729  NA 134* 135 139  K 3.9 3.7 3.5  CL 98 98 101  CO2 25 26 27   GLUCOSE 83 77 88  BUN 7 6 <5*  CREATININE 0.50* 0.59* 0.67  CALCIUM 9.3 9.5 9.9  MG  --  1.8  --   PHOS  --  3.8  --     GFR: Estimated Creatinine Clearance: 71.4 mL/min (by C-G formula based on SCr of 0.67 mg/dL). Liver Function Tests: Recent Labs  Lab 10/08/20 1330 10/09/20 0415 10/10/20 0729  AST 44* 30 26  ALT 27 22 19   ALKPHOS 72 67 74  BILITOT 0.4 1.1 1.2  PROT 7.7 7.0 7.4  ALBUMIN 4.0 3.3* 3.5    Recent Labs  Lab 10/08/20 1330 10/10/20 0729  LIPASE 106* 148*    No results for input(s): AMMONIA in the last 168 hours. Coagulation Profile: No results for input(s): INR, PROTIME in the last 168 hours. Cardiac Enzymes: No results for input(s): CKTOTAL, CKMB, CKMBINDEX, TROPONINI in the last 168 hours. BNP (last 3 results) No results for input(s): PROBNP in the last 8760 hours. HbA1C: No results for input(s): HGBA1C in the last 72 hours. CBG: No results for input(s): GLUCAP in the last 168 hours. Lipid Profile: No results for input(s): CHOL, HDL, LDLCALC, TRIG, CHOLHDL, LDLDIRECT in the last 72 hours. Thyroid Function Tests: No results for input(s): TSH, T4TOTAL, FREET4, T3FREE, THYROIDAB in the last 72 hours. Anemia Panel: No results for input(s): VITAMINB12, FOLATE, FERRITIN, TIBC, IRON, RETICCTPCT in the last 72 hours. Sepsis Labs: No results for input(s): PROCALCITON, LATICACIDVEN in the last 168 hours.  Recent Results (from the past 240 hour(s))  Resp Panel by RT-PCR (Flu A&B, Covid) Nasopharyngeal Swab     Status: None   Collection Time: 10/08/20  9:25 PM   Specimen: Nasopharyngeal Swab; Nasopharyngeal(NP) swabs in vial  transport medium  Result Value Ref Range Status   SARS Coronavirus 2 by RT PCR NEGATIVE NEGATIVE Final    Comment: (NOTE) SARS-CoV-2 target nucleic acids are NOT DETECTED.  The SARS-CoV-2 RNA is generally detectable in upper respiratory specimens during the acute phase of infection. The lowest concentration of SARS-CoV-2 viral copies this assay can detect is 138 copies/mL. A negative result does not preclude SARS-Cov-2 infection and should not be used as the sole basis for treatment or other patient management decisions. A negative result may occur with  improper specimen collection/handling, submission of specimen other than nasopharyngeal swab, presence of viral mutation(s) within the areas targeted by this assay, and inadequate number of viral copies(<138 copies/mL). A negative result must be combined with clinical observations, patient history, and epidemiological information. The expected result is Negative.  Fact Sheet for Patients:  BloggerCourse.com  Fact Sheet for Healthcare Providers:  SeriousBroker.it  This test is no t yet approved or cleared by the [place] FDA and  has been authorized for detection and/or diagnosis of SARS-CoV-2 by FDA under an Emergency Use Authorization (EUA). This EUA will remain  in effect (meaning this test can be used) for the duration of the COVID-19 declaration under Section 564(b)(1) of the Act, 21 U.S.C.section 360bbb-3(b)(1), unless the authorization is terminated  or revoked sooner.       Influenza A by PCR NEGATIVE NEGATIVE Final   Influenza B by PCR NEGATIVE NEGATIVE Final    Comment: (NOTE) The Xpert Xpress SARS-CoV-2/FLU/RSV plus assay is intended as an aid in the diagnosis of influenza from Nasopharyngeal swab specimens and should not be used as a sole basis for treatment. Nasal washings and aspirates are unacceptable for Xpert Xpress SARS-CoV-2/FLU/RSV testing.  Fact  Sheet for Patients: BloggerCourse.com  Fact Sheet for Healthcare Providers: SeriousBroker.it  This test is not yet approved or cleared by the [place] FDA and has been authorized for detection and/or diagnosis of SARS-CoV-2 by FDA under an Emergency Use Authorization (EUA). This EUA will remain in effect (meaning this test can be used) for the duration of the COVID-19 declaration under Section 564(b)(1) of the Act, 21 U.S.C. section 360bbb-3(b)(1), unless the authorization is terminated or revoked.  Performed at [facility], [address], [place], [place] [address]           Radiology Studies: CT ABDOMEN PELVIS W  CONTRAST  Result Date: 10/08/2020 CLINICAL DATA:  Epigastric pain EXAM: CT ABDOMEN AND PELVIS WITH CONTRAST TECHNIQUE: Multidetector CT imaging of the abdomen and pelvis was performed using the standard protocol following bolus administration of intravenous contrast. CONTRAST:  66mL OMNIPAQUE IOHEXOL 350 MG/ML SOLN COMPARISON:  CT 09/29/2020 FINDINGS: Lower chest: Lung bases demonstrate no acute consolidation or effusion. Normal cardiac size. Hepatobiliary: Small gallstones. Hepatic steatosis. No biliary dilatation Pancreas: Generalized stranding and fluid within the mesentery consistent with pancreatitis. No ductal dilatation. Organizing fluid collection now evident between the stomach and proximal pancreas, this measures 2.8 by 2.1 cm, series 7 image 16. Spleen: Previously noted hyperenhancing focus is not as well seen today Adrenals/Urinary Tract: Adrenal glands normal. Kidneys show no hydronephrosis. Subcentimeter hypodensity left kidney too small to further characterize. The bladder is unremarkable. Stomach/Bowel: Stomach is nonenlarged. Mild rim enhancing fluid collection along the greater curvature of the stomach, measures approximately 4.8 by 1.3 cm by 3.1 cm and appears slightly decreased. No  dilated small bowel. No acute bowel wall thickening. Vascular/Lymphatic: Nonaneurysmal aorta. Mild atherosclerosis. No suspicious nodes. Portal vein is patent. There is narrowed appearance of the splenic vein but without occlusion or definitive thrombus. Reproductive: Prostate is unremarkable. Other: Negative for free air. Small amount of free fluid in the pelvis and abdomen. Generalized mesenteric soft tissue stranding. Musculoskeletal: No acute or significant osseous findings. IMPRESSION: 1. Residual edema and fluid anterior to the body and tail of pancreas consistent with pancreatitis. Slight interval decrease in size of the rim enhancing fluid collection at greater curvature of stomach, but interval development of a 2.8 cm organizing fluid collection between the stomach and body of pancreas, probable developing pseudocyst. 2. Gallstones 3. Hepatic steatosis 4. Small free fluid in the pelvis and abdomen Electronically Signed   By: Jasmine Pang M.D.   On: 10/08/2020 18:13        Scheduled Meds:  (feeding supplement) PROSource Plus  30 mL Oral BID BM   enoxaparin (LOVENOX) injection  40 mg Subcutaneous Q24H   feeding supplement  1 Container Oral BID BM   folic acid  1 mg Oral Daily   multivitamin with minerals  1 tablet Oral Daily   pantoprazole (PROTONIX) IV  40 mg Intravenous Q24H   thiamine  100 mg Oral Daily   Or   thiamine  100 mg Intravenous Daily   Continuous Infusions:  lactated ringers 125 mL/hr at 10/10/20 1447     LOS: 2 days    Time spent: 36 MIN    Alwyn Ren, MD 10/10/2020, 3:06 PM

## 2020-10-11 DIAGNOSIS — E43 Unspecified severe protein-calorie malnutrition: Secondary | ICD-10-CM | POA: Insufficient documentation

## 2020-10-11 LAB — COMPREHENSIVE METABOLIC PANEL
ALT: 16 U/L (ref 0–44)
AST: 20 U/L (ref 15–41)
Albumin: 3.2 g/dL — ABNORMAL LOW (ref 3.5–5.0)
Alkaline Phosphatase: 56 U/L (ref 38–126)
Anion gap: 8 (ref 5–15)
BUN: 6 mg/dL (ref 6–20)
CO2: 27 mmol/L (ref 22–32)
Calcium: 9 mg/dL (ref 8.9–10.3)
Chloride: 98 mmol/L (ref 98–111)
Creatinine, Ser: 0.52 mg/dL — ABNORMAL LOW (ref 0.61–1.24)
GFR, Estimated: >60 mL/min (ref >60)
Glucose, Bld: 99 mg/dL (ref 70–99)
Potassium: 3 mmol/L — ABNORMAL LOW (ref 3.5–5.1)
Sodium: 133 mmol/L — ABNORMAL LOW (ref 135–145)
Total Bilirubin: 0.8 mg/dL (ref 0.3–1.2)
Total Protein: 6.5 g/dL (ref 6.5–8.1)

## 2020-10-11 LAB — CBC
HCT: 32.7 % — ABNORMAL LOW (ref 39.0–52.0)
Hemoglobin: 11.3 g/dL — ABNORMAL LOW (ref 13.0–17.0)
MCH: 35.4 pg — ABNORMAL HIGH (ref 26.0–34.0)
MCHC: 34.6 g/dL (ref 30.0–36.0)
MCV: 102.5 fL — ABNORMAL HIGH (ref 80.0–100.0)
Platelets: 173 10*3/uL (ref 150–400)
RBC: 3.19 MIL/uL — ABNORMAL LOW (ref 4.22–5.81)
RDW: 12.2 % (ref 11.5–15.5)
WBC: 5.1 10*3/uL (ref 4.0–10.5)
nRBC: 0 % (ref 0.0–0.2)

## 2020-10-11 LAB — LIPASE, BLOOD: Lipase: 196 U/L — ABNORMAL HIGH (ref 11–51)

## 2020-10-11 MED ORDER — MORPHINE SULFATE (PF) 2 MG/ML IV SOLN
2.0000 mg | INTRAVENOUS | Status: DC | PRN
  Administered 2020-10-11 – 2020-10-12 (×3): 2 mg via INTRAVENOUS
  Filled 2020-10-11 (×3): qty 1

## 2020-10-11 MED ORDER — POTASSIUM CHLORIDE CRYS ER 20 MEQ PO TBCR
40.0000 meq | EXTENDED_RELEASE_TABLET | ORAL | Status: AC
  Administered 2020-10-11 (×2): 40 meq via ORAL
  Filled 2020-10-11 (×2): qty 2

## 2020-10-11 MED ORDER — LIDOCAINE VISCOUS HCL 2 % MT SOLN
15.0000 mL | Freq: Once | OROMUCOSAL | Status: AC
  Administered 2020-10-11: 15 mL via ORAL
  Filled 2020-10-11 (×2): qty 15

## 2020-10-11 MED ORDER — ACETAMINOPHEN 325 MG PO TABS: 650.0000 mg | ORAL_TABLET | ORAL | Status: DC | PRN

## 2020-10-11 MED ORDER — ALUM & MAG HYDROXIDE-SIMETH 200-200-20 MG/5ML PO SUSP: 30.0000 mL | ORAL | Status: DC | PRN

## 2020-10-11 MED ORDER — LABETALOL HCL 5 MG/ML IV SOLN: 10.0000 mg | INTRAVENOUS | Status: DC | PRN

## 2020-10-11 MED ORDER — ALUM & MAG HYDROXIDE-SIMETH 200-200-20 MG/5ML PO SUSP
30.0000 mL | Freq: Once | ORAL | Status: AC
  Administered 2020-10-11: 30 mL via ORAL
  Filled 2020-10-11: qty 30

## 2020-10-11 MED ORDER — TRAMADOL HCL 50 MG PO TABS: 50.0000 mg | ORAL_TABLET | Freq: Four times a day (QID) | ORAL | Status: DC | PRN

## 2020-10-11 MED ORDER — ACETAMINOPHEN 650 MG RE SUPP: 650.0000 mg | Freq: Four times a day (QID) | RECTAL | Status: DC | PRN

## 2020-10-11 MED ORDER — HYDRALAZINE HCL 25 MG PO TABS
25.0000 mg | ORAL_TABLET | ORAL | Status: DC | PRN
Start: 2020-10-11 — End: 2020-10-12

## 2020-10-11 MED ORDER — PROCHLORPERAZINE EDISYLATE 10 MG/2ML IJ SOLN: 10.0000 mg | INTRAMUSCULAR | Status: DC | PRN

## 2020-10-11 NOTE — Assessment & Plan Note (Signed)
-   Patient now wishes to abstain from alcohol on discharge and has been heavily counseled during hospitalization

## 2020-10-11 NOTE — Progress Notes (Signed)
Progress Note    Woodley Petzold   OZH:[id number]  DOB: 07/12/1965  DOA: 10/08/2020     3  PCP: Pcp, No  Initial CC: abdominal pain  Hospital Course: Mr. Mcpartlin is a 55 year old male with past medical history of recent diagnosis of pancreatitis who presented to [facility] emergency department with complaints of abdominal pain.  Of note, patient was admitted to [facility] to the internal medicine residency service from 9/2 until 9/3.  For pancreatitis which was managed per usual care.    There was discussion as to whether patient's pancreatitis was due to alcohol use or gallbladder pancreatitis.  Patient was advised to cease alcohol use and was advised to follow-up with general surgery after discharge.  Patient was discharged home on 9/3.   Patient explains that he felt well for approximately 3 to 4 days after discharge but following that the patient began to develop recurrent epigastric and left upper quadrant pain.  Patient describes this pain as severe in intensity, sharp in quality and radiating to the mid back.  Pain is worse with movement or ingestion of foods.  Pain is improved with rest.  Patient attempted to take ibuprofen and BC powder on multiple occasions to control his pain with no improvement in symptoms.   Patient denies associated diarrhea, nausea, vomiting, recent ingestion of undercooked food, sick contacts or recent contact with COVID-19 infection.   Upon further questioning, patient states that he continues to drink somewhat regularly.  Patient states that he typically drinks at least 6 beers in 1 sitting on at least 3 nights a week.  Since patient's discharge however he has continued to drink alcohol but states that it has been less.   Patient's pain continued to worsen until EMS was contacted who promptly brought the patient into [facility] emergency department for evaluation.   Upon evaluation in the emergency department patient was found  to have a slightly elevated lipase of 106, down from 315 during his last hospitalization.  CT imaging of the abdomen pelvis revealed residual edema and fluid anterior to the body and tail of the pancreas concerning for ongoing pancreatitis with slight interval decrease in the size of a rim-enhancing fluid collection near the greater curvature of the stomach.  However, an interval development of the 2.8 cm organizing fluid collection between the stomach and body of the pancreas has now developed concerning for an additional pseudocyst.  He was treated with bowel rest, fluids, and pain control.  Interval History:  No events overnight.  He is still nervous about eating much food due to worsening his abdominal pain.  Denies any nausea or vomiting.  States that he no longer wishes to consume alcohol after leaving the hospital.  Tried encouraging him to stick with this plan as well.  ROS: Constitutional: negative for chills and fevers, Respiratory: negative for cough, Cardiovascular: negative for chest pain, and Gastrointestinal: positive for abdominal pain  Assessment & Plan: * Acute pancreatitis - Etiology considered due to ongoing significant alcohol use at home.  Patient has been heavily counseled  - per CT: "Residual edema and fluid anterior to the body and tail of pancreas consistent with pancreatitis. Slight interval decrease in size of the rim enhancing fluid collection at greater curvature of stomach, but interval development of a 2.8 cm organizing fluid collection between the stomach and body of pancreas, probable developing pseudocyst." -Patient treated with bowel rest, fluids, pain control - Currently on soft diet but still having abdominal  pain.  If continues we will have to reduce diet again  Pseudocyst of pancreas - No signs of infection at this time.  Given persistent pain and minimal oral intake, will have to watch his clinical status closely in case of need for drainage  still  Protein-calorie malnutrition, severe - Patient's BMI is Body mass index is 17.36 kg/m.. - Patient has the following signs/symptoms consistent with PCM: (fat loss, muscle loss, muscle wasting). - seen by RD, appreciate assistance. Continue plan per RD to include nutritional supplements  Cholelithiasis - No evidence of acute cholecystitis on abdominal imaging  Alcohol abuse - Patient now wishes to abstain from alcohol on discharge and has been heavily counseled during hospitalization    Old records reviewed in assessment of this patient  Antimicrobials:   DVT prophylaxis: enoxaparin (LOVENOX) injection 40 mg Start: 10/09/20 0600   Code Status:   Code Status: Full Code Family Communication:   Disposition Plan: Status is: Inpatient  Remains inpatient appropriate because:Ongoing active pain requiring inpatient pain management and Inpatient level of care appropriate due to severity of illness  Dispo: The patient is from: Home              Anticipated d/c is to: Home              Patient currently is not medically stable to d/c.   Difficult to place patient No      Risk of unplanned readmission score: Unplanned Admission- Pilot do not use: 8.06   Objective: Blood pressure (!) 128/102, pulse 62, temperature 98.1 F (36.7 C), temperature source Oral, resp. rate 16, height 5\' 5"  (1.651 m), weight 47.3 kg, SpO2 100 %.  Examination: General appearance: alert, cooperative, and no distress Head: Normocephalic, without obvious abnormality, atraumatic Eyes:  EOMI Lungs: clear to auscultation bilaterally Heart: regular rate and rhythm and S1, S2 normal Abdomen:  Thin, soft, tender to palpation throughout but worse in the epigastrium.  Bowel sounds present Extremities:  No edema Skin: mobility and turgor normal Neurologic: Grossly normal  Consultants:    Procedures:    Data Reviewed: I have personally reviewed following labs and imaging studies Results for orders  placed or performed during the hospital encounter of 10/08/20 (from the past 24 hour(s))  CBC     Status: Abnormal   Collection Time: 10/11/20  4:08 AM  Result Value Ref Range   WBC 5.1 4.0 - 10.5 K/uL   RBC 3.19 (L) 4.22 - 5.81 MIL/uL   Hemoglobin 11.3 (L) 13.0 - 17.0 g/dL   HCT 10/13/20 (L) 69.6 - 78.9 %   MCV 102.5 (H) 80.0 - 100.0 fL   MCH 35.4 (H) 26.0 - 34.0 pg   MCHC 34.6 30.0 - 36.0 g/dL   RDW 38.1 01.7 - 51.0 %   Platelets 173 150 - 400 K/uL   nRBC 0.0 0.0 - 0.2 %  Comprehensive metabolic panel     Status: Abnormal   Collection Time: 10/11/20  4:08 AM  Result Value Ref Range   Sodium 133 (L) 135 - 145 mmol/L   Potassium 3.0 (L) 3.5 - 5.1 mmol/L   Chloride 98 98 - 111 mmol/L   CO2 27 22 - 32 mmol/L   Glucose, Bld 99 70 - 99 mg/dL   BUN 6 6 - 20 mg/dL   Creatinine, Ser 10/13/20 (L) 0.61 - 1.24 mg/dL   Calcium 9.0 8.9 - 5.27 mg/dL   Total Protein 6.5 6.5 - 8.1 g/dL   Albumin 3.2 (L) 3.5 -  5.0 g/dL   AST 20 15 - 41 U/L   ALT 16 0 - 44 U/L   Alkaline Phosphatase 56 38 - 126 U/L   Total Bilirubin 0.8 0.3 - 1.2 mg/dL   GFR, Estimated >25 >95 mL/min   Anion gap 8 5 - 15  Lipase, blood     Status: Abnormal   Collection Time: 10/11/20  4:08 AM  Result Value Ref Range   Lipase 196 (H) 11 - 51 U/L    Recent Results (from the past 240 hour(s))  Resp Panel by RT-PCR (Flu A&B, Covid) Nasopharyngeal Swab     Status: None   Collection Time: 10/08/20  9:25 PM   Specimen: Nasopharyngeal Swab; Nasopharyngeal(NP) swabs in vial transport medium  Result Value Ref Range Status   SARS Coronavirus 2 by RT PCR NEGATIVE NEGATIVE Final    Comment: (NOTE) SARS-CoV-2 target nucleic acids are NOT DETECTED.  The SARS-CoV-2 RNA is generally detectable in upper respiratory specimens during the acute phase of infection. The lowest concentration of SARS-CoV-2 viral copies this assay can detect is 138 copies/mL. A negative result does not preclude SARS-Cov-2 infection and should not be used as the sole  basis for treatment or other patient management decisions. A negative result may occur with  improper specimen collection/handling, submission of specimen other than nasopharyngeal swab, presence of viral mutation(s) within the areas targeted by this assay, and inadequate number of viral copies(<138 copies/mL). A negative result must be combined with clinical observations, patient history, and epidemiological information. The expected result is Negative.  Fact Sheet for Patients:  BloggerCourse.com  Fact Sheet for Healthcare Providers:  SeriousBroker.it  This test is no t yet approved or cleared by the [place] FDA and  has been authorized for detection and/or diagnosis of SARS-CoV-2 by FDA under an Emergency Use Authorization (EUA). This EUA will remain  in effect (meaning this test can be used) for the duration of the COVID-19 declaration under Section 564(b)(1) of the Act, 21 U.S.C.section 360bbb-3(b)(1), unless the authorization is terminated  or revoked sooner.       Influenza A by PCR NEGATIVE NEGATIVE Final   Influenza B by PCR NEGATIVE NEGATIVE Final    Comment: (NOTE) The Xpert Xpress SARS-CoV-2/FLU/RSV plus assay is intended as an aid in the diagnosis of influenza from Nasopharyngeal swab specimens and should not be used as a sole basis for treatment. Nasal washings and aspirates are unacceptable for Xpert Xpress SARS-CoV-2/FLU/RSV testing.  Fact Sheet for Patients: BloggerCourse.com  Fact Sheet for Healthcare Providers: SeriousBroker.it  This test is not yet approved or cleared by the [place] FDA and has been authorized for detection and/or diagnosis of SARS-CoV-2 by FDA under an Emergency Use Authorization (EUA). This EUA will remain in effect (meaning this test can be used) for the duration of the COVID-19 declaration under Section 564(b)(1) of the Act,  21 U.S.C. section 360bbb-3(b)(1), unless the authorization is terminated or revoked.  Performed at [facility], [address] [place], [place] [address]      Radiology Studies: No results found. CT ABDOMEN PELVIS W CONTRAST  Final Result      Scheduled Meds:  (feeding supplement) PROSource Plus  30 mL Oral BID BM   enoxaparin (LOVENOX) injection  40 mg Subcutaneous Q24H   feeding supplement  1 Container Oral BID BM   folic acid  1 mg Oral Daily   multivitamin with minerals  1 tablet Oral Daily   pantoprazole (PROTONIX) IV  40  mg Intravenous Q24H   thiamine  100 mg Oral Daily   Or   thiamine  100 mg Intravenous Daily   PRN Meds: acetaminophen **OR** acetaminophen, alum & mag hydroxide-simeth, hydrALAZINE, labetalol, ondansetron **OR** ondansetron (ZOFRAN) IV, traMADol Continuous Infusions:  lactated ringers 100 mL/hr at 10/11/20 1052     LOS: 3 days  Time spent: Greater than 50% of the 35 minute visit was spent in counseling/coordination of care for the patient as laid out in the A&P.   Lewie Chamber, MD [facility] 10/11/2020, 12:58 PM

## 2020-10-11 NOTE — Assessment & Plan Note (Signed)
-   No evidence of acute cholecystitis on abdominal imaging

## 2020-10-11 NOTE — Assessment & Plan Note (Addendum)
-   Etiology considered due to ongoing significant alcohol use at home.  Patient has been heavily counseled  - per CT: "Residual edema and fluid anterior to the body and tail of pancreas consistent with pancreatitis. Slight interval decrease in size of the rim enhancing fluid collection at greater curvature of stomach, but interval development of a 2.8 cm organizing fluid collection between the stomach and body of pancreas, probable developing pseudocyst." -Patient treated with bowel rest, fluids, pain control - Tolerated soft diet prior to discharge

## 2020-10-11 NOTE — Hospital Course (Signed)
Nathaniel Hickman is a 55 year old male with past medical history of recent diagnosis of pancreatitis who presented to [facility] emergency department with complaints of abdominal pain.  Of note, patient was admitted to [facility] to the internal medicine residency service from 9/2 until 9/3.  For pancreatitis which was managed per usual care.    There was discussion as to whether patient's pancreatitis was due to alcohol use or gallbladder pancreatitis.  Patient was advised to cease alcohol use and was advised to follow-up with general surgery after discharge.  Patient was discharged home on 9/3.   Patient explains that he felt well for approximately 3 to 4 days after discharge but following that the patient began to develop recurrent epigastric and left upper quadrant pain.  Patient describes this pain as severe in intensity, sharp in quality and radiating to the mid back.  Pain is worse with movement or ingestion of foods.  Pain is improved with rest.  Patient attempted to take ibuprofen and BC powder on multiple occasions to control his pain with no improvement in symptoms.   Patient denies associated diarrhea, nausea, vomiting, recent ingestion of undercooked food, sick contacts or recent contact with COVID-19 infection.   Upon further questioning, patient states that he continues to drink somewhat regularly.  Patient states that he typically drinks at least 6 beers in 1 sitting on at least 3 nights a week.  Since patient's discharge however he has continued to drink alcohol but states that it has been less.   Patient's pain continued to worsen until EMS was contacted who promptly brought the patient into [facility] emergency department for evaluation.   Upon evaluation in the emergency department patient was found to have a slightly elevated lipase of 106, down from 315 during his last hospitalization.  CT imaging of the abdomen pelvis revealed residual edema and fluid  anterior to the body and tail of the pancreas concerning for ongoing pancreatitis with slight interval decrease in the size of a rim-enhancing fluid collection near the greater curvature of the stomach.  However, an interval development of the 2.8 cm organizing fluid collection between the stomach and body of the pancreas has now developed concerning for an additional pseudocyst.  He was treated with bowel rest, fluids, and pain control.

## 2020-10-11 NOTE — Plan of Care (Signed)
  Problem: Education: Goal: Knowledge of General Education information will improve Description: Including pain rating scale, medication(s)/side effects and non-pharmacologic comfort measures Outcome: Progressing   Problem: Health Behavior/Discharge Planning: Goal: Ability to manage health-related needs will improve Outcome: Progressing   Problem: Clinical Measurements: Goal: Will remain free from infection Outcome: Progressing   

## 2020-10-11 NOTE — Assessment & Plan Note (Signed)
-   Patient's BMI is Body mass index is 17.36 kg/m.. - Patient has the following signs/symptoms consistent with PCM: (fat loss, muscle loss, muscle wasting). - seen by RD, appreciate assistance. Continue plan per RD to include nutritional supplements

## 2020-10-11 NOTE — Assessment & Plan Note (Addendum)
No signs of infection at this time.   °

## 2020-10-12 LAB — CBC WITH DIFFERENTIAL/PLATELET
Abs Immature Granulocytes: 0.01 10*3/uL (ref 0.00–0.07)
Basophils Absolute: 0 10*3/uL (ref 0.0–0.1)
Basophils Relative: 1 %
Eosinophils Absolute: 0.2 10*3/uL (ref 0.0–0.5)
Eosinophils Relative: 4 %
HCT: 32 % — ABNORMAL LOW (ref 39.0–52.0)
Hemoglobin: 11 g/dL — ABNORMAL LOW (ref 13.0–17.0)
Immature Granulocytes: 0 %
Lymphocytes Relative: 22 %
Lymphs Abs: 0.8 10*3/uL (ref 0.7–4.0)
MCH: 35.5 pg — ABNORMAL HIGH (ref 26.0–34.0)
MCHC: 34.4 g/dL (ref 30.0–36.0)
MCV: 103.2 fL — ABNORMAL HIGH (ref 80.0–100.0)
Monocytes Absolute: 0.4 10*3/uL (ref 0.1–1.0)
Monocytes Relative: 11 %
Neutro Abs: 2.4 10*3/uL (ref 1.7–7.7)
Neutrophils Relative %: 62 %
Platelets: 163 10*3/uL (ref 150–400)
RBC: 3.1 MIL/uL — ABNORMAL LOW (ref 4.22–5.81)
RDW: 12.5 % (ref 11.5–15.5)
WBC: 3.8 10*3/uL — ABNORMAL LOW (ref 4.0–10.5)
nRBC: 0 % (ref 0.0–0.2)

## 2020-10-12 LAB — COMPREHENSIVE METABOLIC PANEL
ALT: 15 U/L (ref 0–44)
AST: 19 U/L (ref 15–41)
Albumin: 3.1 g/dL — ABNORMAL LOW (ref 3.5–5.0)
Alkaline Phosphatase: 60 U/L (ref 38–126)
Anion gap: 8 (ref 5–15)
BUN: 6 mg/dL (ref 6–20)
CO2: 27 mmol/L (ref 22–32)
Calcium: 9.1 mg/dL (ref 8.9–10.3)
Chloride: 99 mmol/L (ref 98–111)
Creatinine, Ser: 0.66 mg/dL (ref 0.61–1.24)
GFR, Estimated: >60 mL/min (ref >60)
Glucose, Bld: 89 mg/dL (ref 70–99)
Potassium: 3.6 mmol/L (ref 3.5–5.1)
Sodium: 134 mmol/L — ABNORMAL LOW (ref 135–145)
Total Bilirubin: 0.8 mg/dL (ref 0.3–1.2)
Total Protein: 6.7 g/dL (ref 6.5–8.1)

## 2020-10-12 LAB — MAGNESIUM: Magnesium: 1.5 mg/dL — ABNORMAL LOW (ref 1.7–2.4)

## 2020-10-12 LAB — LIPASE, BLOOD: Lipase: 103 U/L — ABNORMAL HIGH (ref 11–51)

## 2020-10-12 MED ORDER — ACETAMINOPHEN 325 MG PO TABS: 650.0000 mg | ORAL_TABLET | ORAL | Status: DC | PRN

## 2020-10-12 NOTE — Discharge Summary (Signed)
Physician Discharge Summary   Nathaniel Hickman XBM:[id number] DOB: 1965-09-23 DOA: 10/08/2020  PCP: Oneita Hurt, No  Admit date: 10/08/2020 Discharge date:  10/12/2020  Admitted From: home Disposition:  home Discharging physician: Lewie Chamber, MD  Recommendations for Outpatient Follow-up:  Abstinence of alcohol Noted to have cholelithiasis and pseudocyst   Home Health:  Equipment/Devices:   Patient discharged to home in Discharge Condition: stable Risk of unplanned readmission score: Unplanned Admission- Pilot do not use: 9.27  CODE STATUS: Full Diet recommendation:  Diet Orders (From admission, onward)     Start     Ordered   10/12/20 0000  Diet general        10/12/20 1131   10/10/20 1513  DIET SOFT Room service appropriate? Yes; Fluid consistency: Thin  Diet effective now       Question Answer Comment  Room service appropriate? Yes   Fluid consistency: Thin      10/10/20 1512            Hospital Course: Mr. Brockbank is a 55 year old male with past medical history of recent diagnosis of pancreatitis who presented to [facility] emergency department with complaints of abdominal pain.  Of note, patient was admitted to [facility] to the internal medicine residency service from 9/2 until 9/3.  For pancreatitis which was managed per usual care.    There was discussion as to whether patient's pancreatitis was due to alcohol use or gallbladder pancreatitis.  Patient was advised to cease alcohol use and was advised to follow-up with general surgery after discharge.  Patient was discharged home on 9/3.   Patient explains that he felt well for approximately 3 to 4 days after discharge but following that the patient began to develop recurrent epigastric and left upper quadrant pain.  Patient describes this pain as severe in intensity, sharp in quality and radiating to the mid back.  Pain is worse with movement or ingestion of foods.  Pain is improved with rest.  Patient  attempted to take ibuprofen and BC powder on multiple occasions to control his pain with no improvement in symptoms.   Patient denies associated diarrhea, nausea, vomiting, recent ingestion of undercooked food, sick contacts or recent contact with COVID-19 infection.   Upon further questioning, patient states that he continues to drink somewhat regularly.  Patient states that he typically drinks at least 6 beers in 1 sitting on at least 3 nights a week.  Since patient's discharge however he has continued to drink alcohol but states that it has been less.   Patient's pain continued to worsen until EMS was contacted who promptly brought the patient into [facility] emergency department for evaluation.   Upon evaluation in the emergency department patient was found to have a slightly elevated lipase of 106, down from 315 during his last hospitalization.  CT imaging of the abdomen pelvis revealed residual edema and fluid anterior to the body and tail of the pancreas concerning for ongoing pancreatitis with slight interval decrease in the size of a rim-enhancing fluid collection near the greater curvature of the stomach.  However, an interval development of the 2.8 cm organizing fluid collection between the stomach and body of the pancreas has now developed concerning for an additional pseudocyst.  He was treated with bowel rest, fluids, and pain control.  * Acute pancreatitis - Etiology considered due to ongoing significant alcohol use at home.  Patient has been heavily counseled  - per CT: "Residual edema and fluid anterior to  the body and tail of pancreas consistent with pancreatitis. 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    Protein-calorie malnutrition, severe - Patient's BMI is Body mass index is 17.36 kg/m.. - Patient has the following signs/symptoms consistent with PCM: (fat loss, muscle loss, muscle wasting). - seen by RD, appreciate assistance. Continue plan per RD to include nutritional supplements  Cholelithiasis - No evidence of acute cholecystitis on abdominal imaging  Alcohol abuse - Patient now wishes to abstain from alcohol on discharge and has been heavily counseled during hospitalization    The patient's chronic medical conditions were treated accordingly per the patient's home medication regimen except as noted.  On day of discharge, patient was felt deemed stable for discharge. Patient/family member advised to call PCP or come back to ER if needed.   Principal Diagnosis: Acute pancreatitis  Discharge Diagnoses: Active Hospital Problems   Diagnosis Date Noted   Acute pancreatitis 09/29/2020    Priority: High   Pseudocyst of pancreas 10/08/2020    Priority: High   Protein-calorie malnutrition, severe 10/11/2020   Alcohol abuse 10/08/2020   Cholelithiasis 10/08/2020    Resolved Hospital Problems  No resolved problems to display.    Discharge Instructions     Diet general   Complete by: As directed    Increase activity slowly   Complete by: As directed       Allergies as of 10/12/2020   No Known Allergies      Medication List     STOP taking these medications    BC HEADACHE POWDER PO       TAKE these medications    acetaminophen 325 MG tablet Commonly known as: TYLENOL Take 2 tablets (650 mg total) by mouth every 4 (four) hours as needed for mild pain or headache (or Fever >/= 101).   ibuprofen 200 MG tablet Commonly known as: ADVIL Take 400 mg by mouth every 6 (six) hours as needed for mild pain.        Follow-up Information     primary care physician. Schedule  an appointment as soon as possible for a visit.                 No Known Allergies  Consultations:   Discharge Exam: BP 117/84 (BP Location: Right Arm)   Pulse (!) 53   Temp 98.6 F (37 C) (Oral)   Resp 18   Ht  (1.651 m)   Wt 47.3 kg   SpO2 100%   BMI 17.36 kg/m  General appearance: alert, cooperative, and no distress Head: Normocephalic, without obvious abnormality, atraumatic Eyes:  EOMI Lungs: clear to auscultation bilaterally Heart: regular rate and rhythm and S1, S2 normal Abdomen:  Thin, soft, tender to palpation throughout but worse in the epigastrium.  Bowel sounds present Extremities:  No edema Skin: mobility and turgor normal Neurologic: Grossly normal  The results of significant diagnostics from this hospitalization (including imaging, microbiology, ancillary and laboratory) are listed below for reference.   Microbiology: Recent Results (from the past 240 hour(s))  Resp Panel by RT-PCR (Flu A&B, Covid) Nasopharyngeal Swab     Status: None   Collection Time: 10/08/20  9:25 PM   Specimen: Nasopharyngeal Swab; Nasopharyngeal(NP) swabs in vial transport medium  Result Value Ref Range Status   SARS Coronavirus 2 by RT PCR NEGATIVE NEGATIVE Final    Comment: (NOTE) SARS-CoV-2 target nucleic acids are NOT DETECTED.  The SARS-CoV-2 RNA is generally detectable in upper respiratory specimens during the acute phase of infection. The lowest concentration of SARS-CoV-2 viral copies this assay can detect is 138 copies/mL. A negative result does not preclude SARS-Cov-2 infection and should not be used as the sole basis for treatment or other patient management decisions. A negative result may occur with  improper specimen collection/handling, submission of specimen other than nasopharyngeal swab, presence of viral mutation(s) within the areas targeted by this assay, and inadequate number of viral copies(<138 copies/mL). A negative result must be combined  with clinical observations, patient history, and epidemiological information. The expected result is Negative.  Fact Sheet for Patients:  BloggerCourse.com  Fact Sheet for Healthcare Providers:  SeriousBroker.it  This test is no t yet approved or cleared by the [place] FDA and  has been authorized for detection and/or diagnosis of SARS-CoV-2 by FDA under an Emergency Use Authorization (EUA). This EUA will remain  in effect (meaning this test can be used) for the duration of the COVID-19 declaration under Section 564(b)(1) of the Act, 21 U.S.C.section 360bbb-3(b)(1), unless the authorization is terminated  or revoked sooner.       Influenza A by PCR NEGATIVE NEGATIVE Final   Influenza B by PCR NEGATIVE NEGATIVE Final    Comment: (NOTE) The Xpert Xpress SARS-CoV-2/FLU/RSV plus assay is intended as an aid in the diagnosis of influenza from Nasopharyngeal swab specimens and should not be used as a sole basis for treatment. Nasal washings and aspirates are unacceptable for Xpert Xpress SARS-CoV-2/FLU/RSV testing.  Fact Sheet for Patients: BloggerCourse.com  Fact Sheet for Healthcare Providers: SeriousBroker.it  This test is not yet approved or cleared by the [place] FDA and has been authorized for detection and/or diagnosis of SARS-CoV-2 by FDA under an Emergency Use Authorization (EUA). This EUA will remain in effect (meaning this test can be used) for the duration of the COVID-19 declaration under Section 564(b)(1) of the Act, 21 U.S.C. section 360bbb-3(b)(1), unless the authorization is terminated or revoked.  Performed at [facility], [address], [place], [place] [address]      Labs: BNP (last 3 results) No results for input(s): BNP in the last 8760 hours. Basic Metabolic Panel: Recent Labs  Lab 10/08/20 1330 10/09/20 0415  10/10/20 0729 10/11/20 0408 10/12/20 0435  NA 134* 135 139 133* 134*  K 3.9 3.7 3.5 3.0* 3.6  CL 98 98 101 98 99  CO2 25 26 27 27 27   GLUCOSE 83 77 88 99 89  BUN 7 6 <5* 6 6  CREATININE 0.50* 0.59* 0.67 0.52* 0.66  CALCIUM 9.3 9.5 9.9 9.0 9.1  MG  --  1.8  --   --  1.5*  PHOS  --  3.8  --   --   --    Liver Function Tests: Recent Labs  Lab 10/08/20 1330 10/09/20 0415 10/10/20 0729 10/11/20 0408 10/12/20 0435  AST 44* 30 26 20 19   ALT 27 22 19 16 15   ALKPHOS 72 67 74 56 60  BILITOT 0.4 1.1 1.2 0.8 0.8  PROT 7.7 7.0 7.4 6.5 6.7  ALBUMIN 4.0 3.3* 3.5 3.2* 3.1*   Recent Labs  Lab 10/08/20 1330 10/10/20 0729 10/11/20 0408 10/12/20 0435  LIPASE 106* 148* 196* 103*   No results for input(s): AMMONIA in the last 168 hours. CBC: Recent Labs  Lab 10/08/20 1330 10/09/20 0415 10/10/20 0729 10/11/20 0408 10/12/20 0435  WBC 6.9 3.7* 5.2 5.1 3.8*  NEUTROABS 4.9 2.2  --   --  2.4  HGB 11.8* 11.6* 12.1* 11.3* 11.0*  HCT 34.5* 33.6* 34.9* 32.7* 32.0*  MCV 103.6* 103.1* 103.9* 102.5* 103.2*  PLT 208 168 189 173 163   Cardiac Enzymes: No results for input(s): CKTOTAL, CKMB, CKMBINDEX, TROPONINI in the last 168 hours. BNP: Invalid input(s): POCBNP CBG: No results for input(s): GLUCAP in the last 168 hours. D-Dimer No results for input(s): DDIMER in the last 72 hours. Hgb A1c No results for input(s): HGBA1C in the last 72 hours. Lipid Profile No results for input(s): CHOL, HDL, LDLCALC, TRIG, CHOLHDL, LDLDIRECT in the last 72 hours. Thyroid function studies No results for input(s): TSH, T4TOTAL, T3FREE, THYROIDAB in the last 72 hours.  Invalid input(s): FREET3 Anemia work up No results for input(s): VITAMINB12, FOLATE, FERRITIN, TIBC, IRON, RETICCTPCT in the last 72 hours. Urinalysis    Component Value Date/Time   COLORURINE YELLOW (A) 10/08/2020 2215   APPEARANCEUR CLEAR (A) 10/08/2020 2215   LABSPEC <=1.005 10/08/2020 2215   PHURINE 7.5 10/08/2020 2215    GLUCOSEU NEGATIVE 10/08/2020 2215   HGBUR NEGATIVE 10/08/2020 2215   BILIRUBINUR NEGATIVE 10/08/2020 2215   KETONESUR NEGATIVE 10/08/2020 2215   PROTEINUR NEGATIVE 10/08/2020 2215   NITRITE NEGATIVE 10/08/2020 2215   LEUKOCYTESUR NEGATIVE 10/08/2020 2215   Sepsis Labs Invalid input(s): PROCALCITONIN,  WBC,  LACTICIDVEN Microbiology Recent Results (from the past 240 hour(s))  Resp Panel by RT-PCR (Flu A&B, Covid) Nasopharyngeal Swab     Status: None   Collection Time: 10/08/20  9:25 PM   Specimen: Nasopharyngeal Swab; Nasopharyngeal(NP) swabs in vial transport medium  Result Value Ref Range Status   SARS Coronavirus 2 by RT PCR NEGATIVE NEGATIVE Final    Comment: (NOTE) SARS-CoV-2 target nucleic acids are NOT DETECTED.  The SARS-CoV-2 RNA is generally detectable in upper respiratory specimens during the acute phase of infection. The lowest concentration of SARS-CoV-2 viral copies this assay can detect is 138 copies/mL. A negative result does not preclude SARS-Cov-2 infection and should not be used as the sole basis for treatment or other patient management decisions. A negative result may occur with  improper specimen collection/handling, submission of specimen other than nasopharyngeal swab, presence of viral mutation(s) within the areas targeted by this assay, and inadequate number of viral copies(<138 copies/mL). A negative result must be combined with clinical observations, patient history, and epidemiological information. The expected result is Negative.  Fact Sheet for Patients:  BloggerCourse.com  Fact Sheet for Healthcare Providers:  SeriousBroker.it  This test is no t yet approved or cleared by the [place] FDA and  has been authorized for detection and/or diagnosis of SARS-CoV-2 by FDA under an Emergency Use Authorization (EUA). This EUA will remain  in effect (meaning this test can be used) for the duration of  the COVID-19 declaration under Section 564(b)(1) of the Act, 21 U.S.C.section 360bbb-3(b)(1), unless the authorization is terminated  or revoked sooner.       Influenza A by PCR NEGATIVE NEGATIVE Final   Influenza B by PCR NEGATIVE NEGATIVE Final    Comment: (NOTE) The Xpert Xpress SARS-CoV-2/FLU/RSV plus assay is intended as an aid in the diagnosis of influenza from Nasopharyngeal swab specimens and should not be used as a sole basis  for treatment. Nasal washings and aspirates are unacceptable for Xpert Xpress SARS-CoV-2/FLU/RSV testing.  Fact Sheet for Patients: BloggerCourse.com  Fact Sheet for Healthcare Providers: SeriousBroker.it  This test is not yet approved or cleared by the [place] FDA and has been authorized for detection and/or diagnosis of SARS-CoV-2 by FDA under an Emergency Use Authorization (EUA). This EUA will remain in effect (meaning this test can be used) for the duration of the COVID-19 declaration under Section 564(b)(1) of the Act, 21 U.S.C. section 360bbb-3(b)(1), unless the authorization is terminated or revoked.  Performed at [facility], [address], [place], [place] [address]     Procedures/Studies: CT ABDOMEN PELVIS W CONTRAST  Result Date: 10/08/2020 CLINICAL DATA:  Epigastric pain EXAM: CT ABDOMEN AND PELVIS WITH CONTRAST TECHNIQUE: Multidetector CT imaging of the abdomen and pelvis was performed using the standard protocol following bolus administration of intravenous contrast. CONTRAST:  41mL OMNIPAQUE IOHEXOL 350 MG/ML SOLN COMPARISON:  CT 09/29/2020 FINDINGS: Lower chest: Lung bases demonstrate no acute consolidation or effusion. Normal cardiac size. Hepatobiliary: Small gallstones. Hepatic steatosis. No biliary dilatation Pancreas: Generalized stranding and fluid within the mesentery consistent with pancreatitis. No ductal dilatation. Organizing fluid collection now  evident between the stomach and proximal pancreas, this measures 2.8 by 2.1 cm, series 7 image 16. Spleen: Previously noted hyperenhancing focus is not as well seen today Adrenals/Urinary Tract: Adrenal glands normal. Kidneys show no hydronephrosis. Subcentimeter hypodensity left kidney too small to further characterize. The bladder is unremarkable. Stomach/Bowel: Stomach is nonenlarged. Mild rim enhancing fluid collection along the greater curvature of the stomach, measures approximately 4.8 by 1.3 cm by 3.1 cm and appears slightly decreased. No dilated small bowel. No acute bowel wall thickening. Vascular/Lymphatic: Nonaneurysmal aorta. Mild atherosclerosis. No suspicious nodes. Portal vein is patent. There is narrowed appearance of the splenic vein but without occlusion or definitive thrombus. Reproductive: Prostate is unremarkable. Other: Negative for free air. Small amount of free fluid in the pelvis and abdomen. Generalized mesenteric soft tissue stranding. Musculoskeletal: No acute or significant osseous findings. IMPRESSION: 1. Residual edema and fluid anterior to the body and tail of pancreas consistent with pancreatitis. Slight interval decrease in size of the rim enhancing fluid collection at greater curvature of stomach, but interval development of a 2.8 cm organizing fluid collection between the stomach and body of pancreas, probable developing pseudocyst. 2. Gallstones 3. Hepatic steatosis 4. Small free fluid in the pelvis and abdomen Electronically Signed   By: Jasmine Pang M.D.   On: 10/08/2020 18:13   CT ABDOMEN PELVIS W CONTRAST  Result Date: 09/29/2020 CLINICAL DATA:  Abdominal pain, question pancreatitis EXAM: CT ABDOMEN AND PELVIS WITH CONTRAST TECHNIQUE: Multidetector CT imaging of the abdomen and pelvis was performed using the standard protocol following bolus administration of intravenous contrast. CONTRAST:  26mL OMNIPAQUE IOHEXOL 350 MG/ML SOLN COMPARISON:  None. FINDINGS: Lower chest:  There is dependent subsegmental atelectasis in the lung bases. The imaged heart is unremarkable. Hepatobiliary: The liver is diffusely hypoattenuating consistent with fatty infiltration. There are no focal lesions. The gallbladder is unremarkable. There is no biliary ductal dilatation. Pancreas: There is stranding in the peripancreatic fat predominantly around the body/tail. There is no main pancreatic ductal dilatation. Spleen: There is a 6 mm enhancing lesion in the superior portion of the spleen. The spleen is otherwise unremarkable. Adrenals/Urinary Tract: The adrenals are unremarkable. A subcentimeter lesion in the left kidney lower pole is too small to characterize but likely reflects a cyst. The kidneys  are otherwise normal, with no other focal lesion, stone, hydronephrosis, or hydroureter. The bladder is unremarkable. Stomach/Bowel: There is a 5.3 cm AP by 2.0 cm TV by 3.4 cm cc peripherally enhancing collection along the greater curvature of the stomach. The stomach wall appears somewhat thickened, though this is likely due to underdistention. There is no evidence of bowel obstruction. There is mild fluid distention of multiple loops of small and large bowel. The appendix is normal. Vascular/Lymphatic: The abdominal aorta is nonaneurysmal. The main portal and splenic veins are patent. There is no abdominal or pelvic lymphadenopathy. Reproductive: The prostate and seminal vesicles are unremarkable. Other: There is scattered free fluid throughout the abdomen and pelvis with mild inflammatory stranding in the mesenteric fat. Musculoskeletal: There is a benign-appearing sclerotic lesion in the left iliac bone. There is no acute osseous abnormality or aggressive osseous lesion. IMPRESSION: 1. Findings above consistent with acute interstitial pancreatitis. 2. 5.3 cm x 2.0 cm x 3.4 cm fluid collection centered along the greater curvature of the stomach likely reflects an acute peripancreatic fluid  collection/developing pseudocyst. Infection cannot be excluded by imaging. 3. Scattered ascites and inflammatory fat stranding throughout the remainder of the abdomen/pelvis. 4. Hepatic steatosis. 5. 6 mm enhancing lesion in the spleen is indeterminate but likely benign. Attention on follow-up studies. Electronically Signed   By: Lesia Hausen M.D.   On: 09/29/2020 08:16   US Abdomen Limited RUQ (LIVER/GB)  Result Date: 09/29/2020 CLINICAL DATA:  Right upper quadrant pain EXAM: ULTRASOUND ABDOMEN LIMITED RIGHT UPPER QUADRANT COMPARISON:  CT 09/29/2020 FINDINGS: Gallbladder: Small gallstones. Normal wall thickness. Negative sonographic Murphy. Trace fluid adjacent to gallbladder. Common bile duct: Diameter: 3 mm Liver: Diffusely echogenic. No focal hepatic abnormality portal vein is patent on color Doppler imaging with normal direction of blood flow towards the liver. Other: None. IMPRESSION: 1. Cholelithiasis with trace fluid adjacent to gallbladder but no wall thickening or sonographic Murphy. If further imaging is required, evaluation with nuclear medicine hepatobiliary imaging could be obtained 2. Echogenic liver consistent with steatosis and or hepatocellular disease Electronically Signed   By: Jasmine Pang M.D.   On: 09/29/2020 19:09     Time coordinating discharge: Over 30 minutes    Lewie Chamber, MD  [facility] 10/12/2020, 1:05 PM

## 2020-10-12 NOTE — Progress Notes (Signed)
Pt is upset this morning d/t unable to get some sleep every time the IV pump is beeping.  Pt demand to stop the IV fluids this morning. RN recommended to change IV site but pt refused. RN tried to explain that he needs the IV fluids but still refuse. RN stopped IV fluids for now. Will continue to monitor.

## 2020-10-13 ENCOUNTER — Encounter: Payer: Self-pay | Admitting: Internal Medicine

## 2020-11-12 ENCOUNTER — Emergency Department (HOSPITAL_COMMUNITY): Payer: Self-pay

## 2020-11-12 ENCOUNTER — Other Ambulatory Visit: Payer: Self-pay

## 2020-11-12 ENCOUNTER — Encounter (HOSPITAL_COMMUNITY): Payer: Self-pay | Admitting: *Deleted

## 2020-11-12 ENCOUNTER — Emergency Department (HOSPITAL_COMMUNITY)
Admission: EM | Admit: 2020-11-12 | Discharge: 2020-11-13 | Disposition: A | Discharge status: Home / Self Care | Payer: Self-pay | Attending: Emergency Medicine | Admitting: Emergency Medicine

## 2020-11-12 DIAGNOSIS — I10 Essential (primary) hypertension: Secondary | ICD-10-CM | POA: Insufficient documentation

## 2020-11-12 DIAGNOSIS — M549 Dorsalgia, unspecified: Secondary | ICD-10-CM | POA: Insufficient documentation

## 2020-11-12 DIAGNOSIS — M545 Low back pain, unspecified: Secondary | ICD-10-CM

## 2020-11-12 DIAGNOSIS — K861 Other chronic pancreatitis: Secondary | ICD-10-CM

## 2020-11-12 DIAGNOSIS — Z79899 Other long term (current) drug therapy: Secondary | ICD-10-CM | POA: Insufficient documentation

## 2020-11-12 DIAGNOSIS — R1013 Epigastric pain: Secondary | ICD-10-CM | POA: Insufficient documentation

## 2020-11-12 DIAGNOSIS — R52 Pain, unspecified: Secondary | ICD-10-CM

## 2020-11-12 HISTORY — DX: Other chronic pain: G89.29

## 2020-11-12 NOTE — ED Provider Notes (Signed)
Emergency Medicine Provider Triage Evaluation Note  Nathaniel Hickman , a 55 y.o. male  was evaluated in triage.  Pt complains of acute on chronic diffuse back pain. Was standing at [facility] today and decided to call EMS. Took Tylenol about 1 hour PTA. Pt states he has had mid back pain for years however has never had it evaluated. It was worse today prompting ED visit. Denies weakness, numbness, tingling to upper or lower extremities. No other complaints.   Review of Systems  Positive: + mid back pain Negative: - weakness, numbness, tingling  Physical Exam  BP (!) 159/112   Pulse 98   Temp 98.3 F (36.8 C) (Oral)   Resp 18   SpO2 100%  Gen:   Awake, no distress   Resp:  Normal effort  MSK:   Moves extremities without difficulty  Other:  + midline T spine TTP with bilateral parathoracic TTP.   Medical Decision Making  Medically screening exam initiated at 9:15 PM.  Appropriate orders placed.  Nathaniel Hickman was informed that the remainder of the evaluation will be completed by another provider, this initial triage assessment does not replace that evaluation, and the importance of remaining in the ED until their evaluation is complete.     Tanda Rockers, PA-C 11/12/20 2115    Benjiman Core, MD 11/15/20 1550

## 2020-11-12 NOTE — ED Triage Notes (Signed)
Pt here via [facility] for back pain.  Acute on chronic.

## 2020-11-13 LAB — CBC WITH DIFFERENTIAL/PLATELET
Abs Immature Granulocytes: 0.02 10*3/uL (ref 0.00–0.07)
Basophils Absolute: 0 10*3/uL (ref 0.0–0.1)
Basophils Relative: 1 %
Eosinophils Absolute: 0 10*3/uL (ref 0.0–0.5)
Eosinophils Relative: 0 %
HCT: 38.3 % — ABNORMAL LOW (ref 39.0–52.0)
Hemoglobin: 13.3 g/dL (ref 13.0–17.0)
Immature Granulocytes: 1 %
Lymphocytes Relative: 16 %
Lymphs Abs: 0.7 10*3/uL (ref 0.7–4.0)
MCH: 35.3 pg — ABNORMAL HIGH (ref 26.0–34.0)
MCHC: 34.7 g/dL (ref 30.0–36.0)
MCV: 101.6 fL — ABNORMAL HIGH (ref 80.0–100.0)
Monocytes Absolute: 0.6 10*3/uL (ref 0.1–1.0)
Monocytes Relative: 13 %
Neutro Abs: 3.1 10*3/uL (ref 1.7–7.7)
Neutrophils Relative %: 69 %
Platelets: 130 10*3/uL — ABNORMAL LOW (ref 150–400)
RBC: 3.77 MIL/uL — ABNORMAL LOW (ref 4.22–5.81)
RDW: 12.8 % (ref 11.5–15.5)
WBC: 4.4 10*3/uL (ref 4.0–10.5)
nRBC: 0 % (ref 0.0–0.2)

## 2020-11-13 LAB — RAPID URINE DRUG SCREEN, HOSP PERFORMED
Amphetamines: NOT DETECTED
Barbiturates: NOT DETECTED
Benzodiazepines: NOT DETECTED
Cocaine: NOT DETECTED
Opiates: NOT DETECTED
Tetrahydrocannabinol: POSITIVE — AB

## 2020-11-13 LAB — COMPREHENSIVE METABOLIC PANEL
ALT: 39 U/L (ref 0–44)
AST: 61 U/L — ABNORMAL HIGH (ref 15–41)
Albumin: 4 g/dL (ref 3.5–5.0)
Alkaline Phosphatase: 75 U/L (ref 38–126)
Anion gap: 15 (ref 5–15)
BUN: 15 mg/dL (ref 6–20)
CO2: 22 mmol/L (ref 22–32)
Calcium: 10.2 mg/dL (ref 8.9–10.3)
Chloride: 91 mmol/L — ABNORMAL LOW (ref 98–111)
Creatinine, Ser: 1.16 mg/dL (ref 0.61–1.24)
GFR, Estimated: >60 mL/min (ref >60)
Glucose, Bld: 92 mg/dL (ref 70–99)
Potassium: 4.1 mmol/L (ref 3.5–5.1)
Sodium: 128 mmol/L — ABNORMAL LOW (ref 135–145)
Total Bilirubin: 1.5 mg/dL — ABNORMAL HIGH (ref 0.3–1.2)
Total Protein: 8.3 g/dL — ABNORMAL HIGH (ref 6.5–8.1)

## 2020-11-13 LAB — LIPASE, BLOOD: Lipase: 196 U/L — ABNORMAL HIGH (ref 11–51)

## 2020-11-13 MED ORDER — HYDROCODONE-ACETAMINOPHEN 5-325 MG PO TABS
1.0000 | ORAL_TABLET | Freq: Once | ORAL | Status: AC
  Administered 2020-11-13: 1 via ORAL
  Filled 2020-11-13: qty 1

## 2020-11-13 NOTE — ED Notes (Signed)
Pt given sprite and sandwich bag. Provided with urinal to give sample

## 2020-11-13 NOTE — ED Provider Notes (Signed)
[facility] EMERGENCY DEPARTMENT Provider Note   CSN: [id number] Arrival date & time: 11/12/20  2107     History Chief Complaint  Patient presents with   Back Pain    Nathaniel Hickman is a 55 y.o. male.  HPI Presents for evaluation of back pain which radiates to his abdomen, epigastric area that worsened yesterday causing him to come here.  He has been using Tylenol for the pain without relief.  He states he was previously seen and evaluated for this and told "do not take ibuprofen."  He was hospitalized twice in September 2022 first for pancreatitis, then for nonspecific abdominal pain.  It was felt that he was using alcohol heavily and he was instructed to stop.  On the second visit he was diagnosed with pancreatic pseudocyst.  He also has cholelithiasis.  Currently, he denies nausea, vomiting, diarrhea, fever, chills, cough, shortness of breath or chest pain.  There are no other known active modifying factors.    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 No pertinent surgical history.     Family History  Problem Relation Age of Onset   Heart disease Neg Hx     Social History   Tobacco Use   Smoking status: Never   Smokeless tobacco: Never  Substance Use Topics   Alcohol use: Not Currently    Alcohol/week: 18.0 standard drinks    Types: 18 Cans of beer per week    Comment: drinks 6 beers a night on 3 diffierent nights a week on average   Drug use: Not Currently    Home Medications Prior to Admission medications   Medication Sig Start Date End Date Taking? Authorizing Provider  acetaminophen (TYLENOL) 325 MG tablet Take 2 tablets (650 mg total) by mouth every 4 (four) hours as  needed for mild pain or headache (or Fever >/= 101). 10/12/20   Lewie Chamber, MD  ibuprofen (ADVIL) 200 MG tablet Take 400 mg by mouth every 6 (six) hours as needed for mild pain.    [provider]    Allergies    Patient has no known allergies.  Review of Systems   Review of Systems  All other systems reviewed and are negative.  Physical Exam Updated Vital Signs BP (!) 127/100 (BP Location: Right Arm)   Pulse (!) 106   Temp 98.9 F (37.2 C)   Resp 17   SpO2 100%   Physical Exam Vitals and nursing note reviewed.  Constitutional:      General: He is not in acute distress.    Appearance: He is well-developed. He is not ill-appearing, toxic-appearing or diaphoretic.     Comments: He appears under nourished  HENT:     Head: Normocephalic and atraumatic.     Right Ear: External ear normal.     Left Ear: External ear normal.     Mouth/Throat:     Mouth: Mucous membranes are moist.     Pharynx: No oropharyngeal exudate.  Eyes:     Conjunctiva/sclera: Conjunctivae normal.     Pupils: Pupils are equal, round, and reactive to light.  Neck:     Trachea: Phonation normal.  Cardiovascular:     Rate and Rhythm: Normal rate  and regular rhythm.     Heart sounds: Normal heart sounds.  Pulmonary:     Effort: Pulmonary effort is normal.     Breath sounds: Normal breath sounds.  Abdominal:     Palpations: Abdomen is soft.     Tenderness: There is abdominal tenderness (Epigastric, mild).  Musculoskeletal:        General: Normal range of motion.     Cervical back: Normal range of motion and neck supple.     Comments: Normal ambulation in the room.  Skin:    General: Skin is warm and dry.  Neurological:     Mental Status: He is alert and oriented to person, place, and time.     Cranial Nerves: No cranial nerve deficit.     Sensory: No sensory deficit.     Motor: No abnormal muscle tone.     Coordination: Coordination normal.     Comments: Normal gait  Psychiatric:         Mood and Affect: Mood normal.        Behavior: Behavior normal.        Thought Content: Thought content normal.        Judgment: Judgment normal.    ED Results / Procedures / Treatments   Labs (all labs ordered are listed, but only abnormal results are displayed) Labs Reviewed - No data to display  EKG None  Radiology DG Thoracic Spine 2 View  Result Date: 11/12/2020 CLINICAL DATA:  Back pain, no known injury, initial encounter EXAM: THORACIC SPINE 2 VIEWS COMPARISON:  None. FINDINGS: Vertebral body height is well maintained. Mild osteophytic changes are seen. No paraspinal mass is noted. No rib abnormality is seen. IMPRESSION: Degenerative change without acute abnormality. Electronically Signed   By: Alcide Clever M.D.   On: 11/12/2020 21:41    Procedures Procedures   Medications Ordered in ED Medications - No data to display  ED Course  I have reviewed the triage vital signs and the nursing notes.  Pertinent labs & imaging results that were available during my care of the patient were reviewed by me and considered in my medical decision making (see chart for details).    MDM Rules/Calculators/A&P                            Patient Vitals for the past 24 hrs:  BP Temp Temp src Pulse Resp SpO2  11/13/20 1300 (!) 127/100 -- -- 92 12 98 %  11/13/20 1200 (!) 118/93 -- -- 99 15 99 %  11/13/20 1100 (!) 115/93 -- -- 93 14 98 %  11/13/20 1030 (!) 117/91 -- -- 93 13 98 %  11/13/20 0751 (!) 127/100 -- -- (!) 106 17 100 %  11/13/20 0225 (!) 145/111 98.9 F (37.2 C) -- 94 19 100 %  11/12/20 2112 (!) 159/112 98.3 F (36.8 C) Oral 98 18 100 %    2:24 PM Reevaluation with update and discussion. After initial assessment and treatment, an updated evaluation reveals more comfortable and tolerating both liquids and solids.  Findings discussed with the patient and all questions were answered. Mancel Bale   Medical Decision Making:  This patient is presenting for evaluation of  back pain with, which does require a range of treatment options, and is a complaint that involves a moderate risk of morbidity and mortality. The differential diagnoses include chronic pancreatitis, metabolic disorder, complications of alcoholism. I decided to review old records, and  in summary millage male, chronically ill, alcoholic, presenting with recurrent symptoms.  Recently hospitalized and found to have a small pancreatic pseudocyst..  I did not require additional historical information from anyone.  Clinical Laboratory Tests Ordered, included CBC, Metabolic panel, and urine drug screen, lipase . Review indicates normal except lipase elevated, sodium low, chloride low, MCV high, urine drug screen with THC, total bilirubin high. Radiologic Tests Ordered, included thoracic spine x-ray.  I independently Visualized: Radiographic images, which show no acute abnormalities   Critical Interventions-clinical evaluation telemetry testing, radiography, observation and reassessment  After These Interventions, the Patient was reevaluated and was found improved, tolerating diet and ambulatory in the ED.  Patient with recent findings for pancreatic pseudocyst, today is nontoxic and has reassuring vital signs.  No indication for hospitalization at this time.  CRITICAL CARE-no Performed by: Mancel Bale  Nursing Notes Reviewed/ Care Coordinated Applicable Imaging Reviewed Interpretation of Laboratory Data incorporated into ED treatment  The patient appears reasonably screened and/or stabilized for discharge and I doubt any other medical condition or other [facility] requiring further screening, evaluation, or treatment in the ED at this time prior to discharge.  Plan: Home Medications-continue usual; Home Treatments-do not drink alcohol; return here if the recommended treatment, does not improve the symptoms; Recommended follow up-PCP, as needed     Final Clinical Impression(s) / ED Diagnoses Final  diagnoses:  Back pain, unspecified back location, unspecified back pain laterality, unspecified chronicity  Hypertension, unspecified type    Rx / DC Orders ED Discharge Orders     None        Mancel Bale, MD 11/13/20 2107

## 2020-11-13 NOTE — Discharge Instructions (Addendum)
There were no serious problems found.  Use Tylenol every 4 hours for pain.  Make sure you continue to avoid alcohol so your pancreas can heal.

## 2021-05-14 ENCOUNTER — Other Ambulatory Visit: Payer: Self-pay

## 2021-05-14 ENCOUNTER — Encounter (HOSPITAL_COMMUNITY): Payer: Self-pay | Admitting: Emergency Medicine

## 2021-05-14 ENCOUNTER — Inpatient Hospital Stay (HOSPITAL_COMMUNITY)
Admission: EM | Admit: 2021-05-14 | Discharge: 2021-05-19 | DRG: 493 | Disposition: A | Discharge status: Home / Self Care | Payer: No Typology Code available for payment source | Attending: Orthopedic Surgery | Admitting: Orthopedic Surgery

## 2021-05-14 ENCOUNTER — Emergency Department (HOSPITAL_COMMUNITY): Payer: No Typology Code available for payment source

## 2021-05-14 DIAGNOSIS — Z23 Encounter for immunization: Secondary | ICD-10-CM | POA: Diagnosis not present

## 2021-05-14 DIAGNOSIS — Y92410 Unspecified street and highway as the place of occurrence of the external cause: Secondary | ICD-10-CM | POA: Diagnosis not present

## 2021-05-14 DIAGNOSIS — S82891A Other fracture of right lower leg, initial encounter for closed fracture: Secondary | ICD-10-CM | POA: Diagnosis present

## 2021-05-14 DIAGNOSIS — Z79899 Other long term (current) drug therapy: Secondary | ICD-10-CM

## 2021-05-14 DIAGNOSIS — F1010 Alcohol abuse, uncomplicated: Secondary | ICD-10-CM | POA: Diagnosis present

## 2021-05-14 DIAGNOSIS — Z7901 Long term (current) use of anticoagulants: Secondary | ICD-10-CM | POA: Diagnosis not present

## 2021-05-14 DIAGNOSIS — S52251A Displaced comminuted fracture of shaft of ulna, right arm, initial encounter for closed fracture: Secondary | ICD-10-CM | POA: Diagnosis present

## 2021-05-14 DIAGNOSIS — D62 Acute posthemorrhagic anemia: Secondary | ICD-10-CM | POA: Diagnosis not present

## 2021-05-14 DIAGNOSIS — M25572 Pain in left ankle and joints of left foot: Secondary | ICD-10-CM | POA: Diagnosis present

## 2021-05-14 DIAGNOSIS — Z560 Unemployment, unspecified: Secondary | ICD-10-CM | POA: Diagnosis not present

## 2021-05-14 DIAGNOSIS — S52209A Unspecified fracture of shaft of unspecified ulna, initial encounter for closed fracture: Secondary | ICD-10-CM | POA: Diagnosis present

## 2021-05-14 DIAGNOSIS — E871 Hypo-osmolality and hyponatremia: Secondary | ICD-10-CM | POA: Diagnosis present

## 2021-05-14 DIAGNOSIS — E559 Vitamin D deficiency, unspecified: Secondary | ICD-10-CM | POA: Diagnosis present

## 2021-05-14 DIAGNOSIS — S8255XA Nondisplaced fracture of medial malleolus of left tibia, initial encounter for closed fracture: Principal | ICD-10-CM | POA: Diagnosis present

## 2021-05-14 DIAGNOSIS — M898X9 Other specified disorders of bone, unspecified site: Secondary | ICD-10-CM | POA: Diagnosis present

## 2021-05-14 DIAGNOSIS — S8262XA Displaced fracture of lateral malleolus of left fibula, initial encounter for closed fracture: Secondary | ICD-10-CM

## 2021-05-14 DIAGNOSIS — Z20822 Contact with and (suspected) exposure to covid-19: Secondary | ICD-10-CM | POA: Diagnosis present

## 2021-05-14 DIAGNOSIS — Z681 Body mass index (BMI) 19 or less, adult: Secondary | ICD-10-CM

## 2021-05-14 DIAGNOSIS — Z789 Other specified health status: Secondary | ICD-10-CM | POA: Diagnosis present

## 2021-05-14 DIAGNOSIS — S82899A Other fracture of unspecified lower leg, initial encounter for closed fracture: Secondary | ICD-10-CM | POA: Diagnosis present

## 2021-05-14 DIAGNOSIS — G8929 Other chronic pain: Secondary | ICD-10-CM | POA: Diagnosis present

## 2021-05-14 DIAGNOSIS — F1090 Alcohol use, unspecified, uncomplicated: Secondary | ICD-10-CM | POA: Diagnosis present

## 2021-05-14 DIAGNOSIS — S82892A Other fracture of left lower leg, initial encounter for closed fracture: Secondary | ICD-10-CM | POA: Diagnosis present

## 2021-05-14 LAB — CBC WITH DIFFERENTIAL/PLATELET
Abs Immature Granulocytes: 0.03 10*3/uL (ref 0.00–0.07)
Basophils Absolute: 0 10*3/uL (ref 0.0–0.1)
Basophils Relative: 0 %
Eosinophils Absolute: 0 10*3/uL (ref 0.0–0.5)
Eosinophils Relative: 1 %
HCT: 33.7 % — ABNORMAL LOW (ref 39.0–52.0)
Hemoglobin: 11 g/dL — ABNORMAL LOW (ref 13.0–17.0)
Immature Granulocytes: 1 %
Lymphocytes Relative: 19 %
Lymphs Abs: 1.1 10*3/uL (ref 0.7–4.0)
MCH: 34.7 pg — ABNORMAL HIGH (ref 26.0–34.0)
MCHC: 32.6 g/dL (ref 30.0–36.0)
MCV: 106.3 fL — ABNORMAL HIGH (ref 80.0–100.0)
Monocytes Absolute: 0.6 10*3/uL (ref 0.1–1.0)
Monocytes Relative: 11 %
Neutro Abs: 4.1 10*3/uL (ref 1.7–7.7)
Neutrophils Relative %: 68 %
Platelets: 194 10*3/uL (ref 150–400)
RBC: 3.17 MIL/uL — ABNORMAL LOW (ref 4.22–5.81)
RDW: 13.3 % (ref 11.5–15.5)
WBC: 5.9 10*3/uL (ref 4.0–10.5)
nRBC: 0 % (ref 0.0–0.2)

## 2021-05-14 LAB — COMPREHENSIVE METABOLIC PANEL
ALT: 31 U/L (ref 0–44)
AST: 36 U/L (ref 15–41)
Albumin: 3.5 g/dL (ref 3.5–5.0)
Alkaline Phosphatase: 88 U/L (ref 38–126)
Anion gap: 11 (ref 5–15)
BUN: 12 mg/dL (ref 6–20)
CO2: 20 mmol/L — ABNORMAL LOW (ref 22–32)
Calcium: 9.2 mg/dL (ref 8.9–10.3)
Chloride: 101 mmol/L (ref 98–111)
Creatinine, Ser: 0.83 mg/dL (ref 0.61–1.24)
GFR, Estimated: >60 mL/min (ref >60)
Glucose, Bld: 92 mg/dL (ref 70–99)
Potassium: 4.1 mmol/L (ref 3.5–5.1)
Sodium: 132 mmol/L — ABNORMAL LOW (ref 135–145)
Total Bilirubin: 1.4 mg/dL — ABNORMAL HIGH (ref 0.3–1.2)
Total Protein: 7.1 g/dL (ref 6.5–8.1)

## 2021-05-14 LAB — ETHANOL: Alcohol, Ethyl (B): <10 mg/dL (ref <10)

## 2021-05-14 MED ORDER — METHOCARBAMOL 1000 MG/10ML IJ SOLN
500.0000 mg | Freq: Four times a day (QID) | INTRAVENOUS | Status: DC | PRN
  Filled 2021-05-14: qty 5

## 2021-05-14 MED ORDER — SORBITOL 70 % SOLN
30.0000 mL | Freq: Every day | Status: DC | PRN
  Filled 2021-05-14: qty 30

## 2021-05-14 MED ORDER — ONDANSETRON HCL 4 MG/2ML IJ SOLN: 4.0000 mg | Freq: Three times a day (TID) | INTRAMUSCULAR | Status: AC | PRN

## 2021-05-14 MED ORDER — METOCLOPRAMIDE HCL 5 MG PO TABS
5.0000 mg | ORAL_TABLET | Freq: Three times a day (TID) | ORAL | Status: DC | PRN
  Filled 2021-05-14: qty 1

## 2021-05-14 MED ORDER — ONDANSETRON HCL 4 MG PO TABS: 4.0000 mg | ORAL_TABLET | Freq: Four times a day (QID) | ORAL | Status: DC | PRN

## 2021-05-14 MED ORDER — HYDROCODONE-ACETAMINOPHEN 5-325 MG PO TABS
2.0000 | ORAL_TABLET | Freq: Four times a day (QID) | ORAL | Status: DC | PRN
  Administered 2021-05-14: 2 via ORAL
  Filled 2021-05-14 (×2): qty 2

## 2021-05-14 MED ORDER — METHOCARBAMOL 500 MG PO TABS
500.0000 mg | ORAL_TABLET | Freq: Four times a day (QID) | ORAL | Status: DC | PRN
  Administered 2021-05-15: 500 mg via ORAL
  Filled 2021-05-14: qty 1

## 2021-05-14 MED ORDER — ADULT MULTIVITAMIN W/MINERALS CH
1.0000 | ORAL_TABLET | Freq: Every day | ORAL | Status: DC
  Administered 2021-05-14 – 2021-05-19 (×6): 1 via ORAL
  Filled 2021-05-14 (×6): qty 1

## 2021-05-14 MED ORDER — OXYCODONE-ACETAMINOPHEN 5-325 MG PO TABS
1.0000 | ORAL_TABLET | Freq: Once | ORAL | Status: AC
  Administered 2021-05-14: 1 via ORAL
  Filled 2021-05-14: qty 1

## 2021-05-14 MED ORDER — METOCLOPRAMIDE HCL 5 MG/ML IJ SOLN: 5.0000 mg | Freq: Three times a day (TID) | INTRAMUSCULAR | Status: DC | PRN

## 2021-05-14 MED ORDER — DIPHENHYDRAMINE HCL 12.5 MG/5ML PO ELIX: 12.5000 mg | ORAL_SOLUTION | ORAL | Status: DC | PRN

## 2021-05-14 MED ORDER — HYDROMORPHONE HCL 1 MG/ML IJ SOLN
0.5000 mg | INTRAMUSCULAR | Status: AC | PRN
  Administered 2021-05-14: 0.5 mg via INTRAVENOUS
  Filled 2021-05-14: qty 1

## 2021-05-14 MED ORDER — ONDANSETRON HCL 4 MG/2ML IJ SOLN: 4.0000 mg | Freq: Four times a day (QID) | INTRAMUSCULAR | Status: DC | PRN

## 2021-05-14 MED ORDER — LORAZEPAM 2 MG/ML IJ SOLN: 0.0000 mg | Freq: Four times a day (QID) | INTRAMUSCULAR | Status: AC

## 2021-05-14 MED ORDER — SODIUM CHLORIDE 0.9 % IV SOLN: INTRAVENOUS | Status: DC

## 2021-05-14 MED ORDER — ONDANSETRON HCL 4 MG/2ML IJ SOLN
4.0000 mg | Freq: Once | INTRAMUSCULAR | Status: AC
  Administered 2021-05-14: 4 mg via INTRAVENOUS
  Filled 2021-05-14: qty 2

## 2021-05-14 MED ORDER — HYDROCODONE-ACETAMINOPHEN 5-325 MG PO TABS
1.0000 | ORAL_TABLET | ORAL | Status: DC | PRN
  Administered 2021-05-15: 2 via ORAL

## 2021-05-14 MED ORDER — LORAZEPAM 2 MG/ML IJ SOLN: 1.0000 mg | INTRAMUSCULAR | Status: AC | PRN

## 2021-05-14 MED ORDER — KETOROLAC TROMETHAMINE 30 MG/ML IJ SOLN: 30.0000 mg | Freq: Once | INTRAMUSCULAR | Status: DC

## 2021-05-14 MED ORDER — LORAZEPAM 1 MG PO TABS
1.0000 mg | ORAL_TABLET | ORAL | Status: AC | PRN
  Administered 2021-05-15: 1 mg via ORAL
  Filled 2021-05-14: qty 1

## 2021-05-14 MED ORDER — ENOXAPARIN SODIUM 40 MG/0.4ML IJ SOSY: 40.0000 mg | PREFILLED_SYRINGE | Freq: Once | INTRAMUSCULAR | Status: DC

## 2021-05-14 MED ORDER — ACETAMINOPHEN 500 MG PO TABS
500.0000 mg | ORAL_TABLET | Freq: Four times a day (QID) | ORAL | Status: AC
  Administered 2021-05-14 – 2021-05-15 (×3): 500 mg via ORAL
  Filled 2021-05-14 (×3): qty 1

## 2021-05-14 MED ORDER — FOLIC ACID 1 MG PO TABS
1.0000 mg | ORAL_TABLET | Freq: Every day | ORAL | Status: DC
  Administered 2021-05-14 – 2021-05-19 (×6): 1 mg via ORAL
  Filled 2021-05-14 (×6): qty 1

## 2021-05-14 MED ORDER — HYDROMORPHONE HCL 1 MG/ML IJ SOLN
0.5000 mg | Freq: Once | INTRAMUSCULAR | Status: AC
  Administered 2021-05-14: 0.5 mg via INTRAVENOUS
  Filled 2021-05-14: qty 1

## 2021-05-14 MED ORDER — THIAMINE HCL 100 MG PO TABS
100.0000 mg | ORAL_TABLET | Freq: Every day | ORAL | Status: DC
  Administered 2021-05-14 – 2021-05-19 (×6): 100 mg via ORAL
  Filled 2021-05-14 (×6): qty 1

## 2021-05-14 MED ORDER — ACETAMINOPHEN 325 MG PO TABS: 325.0000 mg | ORAL_TABLET | Freq: Four times a day (QID) | ORAL | Status: DC | PRN

## 2021-05-14 MED ORDER — THIAMINE HCL 100 MG/ML IJ SOLN
100.0000 mg | Freq: Every day | INTRAMUSCULAR | Status: DC
  Filled 2021-05-14: qty 2

## 2021-05-14 MED ORDER — LORAZEPAM 2 MG/ML IJ SOLN: 0.0000 mg | Freq: Two times a day (BID) | INTRAMUSCULAR | Status: AC

## 2021-05-14 MED ORDER — POLYETHYLENE GLYCOL 3350 17 G PO PACK: 17.0000 g | PACK | Freq: Every day | ORAL | Status: DC | PRN

## 2021-05-14 MED ORDER — TETANUS-DIPHTH-ACELL PERTUSSIS 5-2.5-18.5 LF-MCG/0.5 IM SUSY
0.5000 mL | PREFILLED_SYRINGE | Freq: Once | INTRAMUSCULAR | Status: AC
  Administered 2021-05-14: 0.5 mL via INTRAMUSCULAR
  Filled 2021-05-14: qty 0.5

## 2021-05-14 MED ORDER — DOCUSATE SODIUM 100 MG PO CAPS
100.0000 mg | ORAL_CAPSULE | Freq: Two times a day (BID) | ORAL | Status: DC
  Administered 2021-05-14 – 2021-05-19 (×10): 100 mg via ORAL
  Filled 2021-05-14 (×10): qty 1
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
## 2021-05-14 NOTE — Discharge Instructions (Addendum)
Follow up with orthopedics, call to schedule an appointment. ? ?Information on my medicine - XARELTO? (Rivaroxaban) ? ?This medication education was reviewed with me or my healthcare representative as part of my discharge preparation.    ? ?Why was Xarelto? prescribed for you? ?Xarelto? was prescribed for you to reduce the risk of blood clots forming after orthopedic surgery. The medical term for these abnormal blood clots is venous thromboembolism (VTE). ? ?What do you need to know about xarelto? ? ?Take your Xarelto? ONCE DAILY at the same time every day. ?You may take it either with or without food. ? ?If you have difficulty swallowing the tablet whole, you may crush it and mix in applesauce just prior to taking your dose. ? ?Take Xarelto? exactly as prescribed by your doctor and DO NOT stop taking Xarelto? without talking to the doctor who prescribed the medication.  Stopping without other VTE prevention medication to take the place of Xarelto? may increase your risk of developing a clot. ? ?After discharge, you should have regular check-up appointments with your healthcare provider that is prescribing your Xarelto?.   ? ?What do you do if you miss a dose? ?If you miss a dose, take it as soon as you remember on the same day then continue your regularly scheduled once daily regimen the next day. Do not take two doses of Xarelto? on the same day.  ? ?Important Safety Information ?A possible side effect of Xarelto? is bleeding. You should call your healthcare provider right away if you experience any of the following: ?Bleeding from an injury or your nose that does not stop. ?Unusual colored urine (red or dark brown) or unusual colored stools (red or black). ?Unusual bruising for unknown reasons. ?A serious fall or if you hit your head (even if there is no bleeding). ? ?Some medicines may interact with Xarelto? and might increase your risk of bleeding while on Xarelto?Marland Kitchen To help avoid this, consult your healthcare  provider or pharmacist prior to using any new prescription or non-prescription medications, including herbals, vitamins, non-steroidal anti-inflammatory drugs (NSAIDs) and supplements. ? ?This website has more information on Xarelto?: VisitDestination.com.br. ? ? ? ?Orthopaedic Trauma Service Discharge Instructions ? ? ?General Discharge Instructions ? ?Orthopaedic Injuries: ? Right ulnar fracture treated with open reduction and internal fixation using plate and screws ?            Left medial malleolus fracture treated with open reduction internal fixation using plate and screws ? ?WEIGHT BEARING STATUS: Okay to weight-bear through right elbow using platform walker.  No weightbearing to right wrist.  Nonweightbearing left ankle.  Cam boot needs to be on when mobilizing ? ?RANGE OF MOTION/ACTIVITY: Unrestricted range of motion of right fingers, elbow and shoulder.  Okay for gentle ankle motion ? ?Bone health: Labs show vitamin D insufficiency.  Supplement with vitamin D that has been prescribed for you this will aid in bone healing ? ?Review the following resource for additional information regarding bone health ? ?[email or web address].cy ? ?Wound Care: Daily wound care to right ankle starting upon discharge home.  Clean with soap and water only.  Can leave open to air once dry or you may cover it with a dressing if the boot irritates the incision.   ? ?DO NOT REMOVE DRESSING ON RIGHT ARM  ? ?DVT/PE prophylaxis: xarelto 15 mg daily x 30 days  ? ?Diet: as you were eating previously.  Can use over the counter stool softeners and bowel preparations, such as Miralax, to  help with bowel movements.  Narcotics can be constipating.  Be sure to drink plenty of fluids ? ?PAIN MEDICATION USE AND EXPECTATIONS ? You have likely been given narcotic medications to help control your pain.  After a traumatic event that results in an fracture (broken bone) with or without surgery, it is ok to use narcotic pain medications  to help control one's pain.  We understand that everyone responds to pain differently and each individual patient will be evaluated on a regular basis for the continued need for narcotic medications. Ideally, narcotic medication use should last no more than 6-8 weeks (coinciding with fracture healing).  ? As a patient it is your responsibility as well to monitor narcotic medication use and report the amount and frequency you use these medications when you come to your office visit.  ? We would also advise that if you are using narcotic medications, you should take a dose prior to therapy to maximize you participation. ? ?IF YOU ARE ON NARCOTIC MEDICATIONS IT IS NOT PERMISSIBLE TO OPERATE A MOTOR VEHICLE (MOTORCYCLE/CAR/TRUCK/MOPED) OR HEAVY MACHINERY ?DO NOT MIX NARCOTICS WITH OTHER CNS (CENTRAL NERVOUS SYSTEM) DEPRESSANTS SUCH AS ALCOHOL ? ? ?POST-OPERATIVE OPIOID TAPER INSTRUCTIONS: ?It is important to wean off of your opioid medication as soon as possible. If you do not need pain medication after your surgery it is ok to stop day one. ?Opioids include: ?Codeine, Hydrocodone(Norco, Vicodin), Oxycodone(Percocet, oxycontin) and hydromorphone amongst others.  ?Long term and even short term use of opiods can cause: ?Increased pain response ?Dependence ?Constipation ?Depression ?Respiratory depression ?And more.  ?Withdrawal symptoms can include ?Flu like symptoms ?Nausea, vomiting ?And more ?Techniques to manage these symptoms ?Hydrate well ?Eat regular healthy meals ?Stay active ?Use relaxation techniques(deep breathing, meditating, yoga) ?Do Not substitute Alcohol to help with tapering ?If you have been on opioids for less than two weeks and do not have pain than it is ok to stop all together.  ?Plan to wean off of opioids ?This plan should start within one week post op of your fracture surgery  ?Maintain the same interval or time between taking each dose and first decrease the dose.  ?Cut the total daily intake of  opioids by one tablet each day ?Next start to increase the time between doses. ?The last dose that should be eliminated is the evening dose.  ? ? ?STOP SMOKING OR USING NICOTINE PRODUCTS!!!! ? As discussed nicotine severely impairs your body's ability to heal surgical and traumatic wounds but also impairs bone healing.  Wounds and bone heal by forming microscopic blood vessels (angiogenesis) and nicotine is a vasoconstrictor (essentially, shrinks blood vessels).  Therefore, if vasoconstriction occurs to these microscopic blood vessels they essentially disappear and are unable to deliver necessary nutrients to the healing tissue.  This is one modifiable factor that you can do to dramatically increase your chances of healing your injury.   ? (This means no smoking, no nicotine gum, patches, etc) ? ?DO NOT USE NONSTEROIDAL ANTI-INFLAMMATORY DRUGS (NSAID'S) ? Using products such as Advil (ibuprofen), Aleve (naproxen), Motrin (ibuprofen) for additional pain control during fracture healing can delay and/or prevent the healing response.  If you would like to take over the counter (OTC) medication, Tylenol (acetaminophen) is ok.  However, some narcotic medications that are given for pain control contain acetaminophen as well. Therefore, you should not exceed more than 4000 mg of tylenol in a day if you do not have liver disease.  Also note that there are may OTC medicines,  such as cold medicines and allergy medicines that my contain tylenol as well.  If you have any questions about medications and/or interactions please ask your doctor/PA or your pharmacist.  ?   ? ?ICE AND ELEVATE INJURED/OPERATIVE EXTREMITY ? Using ice and elevating the injured extremity above your heart can help with swelling and pain control.  Icing in a pulsatile fashion, such as 20 minutes on and 20 minutes off, can be followed.   ? Do not place ice directly on skin. Make sure there is a barrier between to skin and the ice pack.   ? Using frozen items  such as frozen peas works well as the conform nicely to the are that needs to be iced. ? ?USE AN ACE WRAP OR TED HOSE FOR SWELLING CONTROL ? In addition to icing and elevation, Ace wraps or TED hose a
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
## 2021-05-14 NOTE — ED Notes (Signed)
Ortho tech called, aware of need of splints ?
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
## 2021-05-14 NOTE — ED Notes (Signed)
Admit provider at bedside at this time 
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
## 2021-05-14 NOTE — ED Notes (Signed)
Pt was previously provided a meal tray with something to drink. Pt denies any further needs. Advised pt he is unable to have anything to eat or drink after midnight. Pt verbalized understanding ?

## 2021-05-14 NOTE — ED Notes (Signed)
Right wrist and left ankle elevated with ice packs applied ?

## 2021-05-14 NOTE — Progress Notes (Signed)
Orthopedic Tech Progress Note ?Patient Details:  ?Nathaniel Hickman ?Aug 16, 1965 ?[id number] ? ?Ortho Devices ?Type of Ortho Device: Sugartong splint, Post (short) splint, Post (short leg) splint ?Splint Material: Fiberglass ?Ortho Device/Splint Location: LLE; RUE ?Ortho Device/Splint Interventions: Ordered, Application, Adjustment ?  ?Post Interventions ?Patient Tolerated: Well ?Instructions Provided: Care of device ? ?Darleen Crocker ?05/14/2021, 1:47 PM ? ?

## 2021-05-14 NOTE — ED Notes (Signed)
Verbal report received from Kaylyn Layer RN at this time ?
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
## 2021-05-14 NOTE — Assessment & Plan Note (Signed)
Reports drinking a sixpack of beer over a week.  Last drink 2 days prior to admission.  Denies prior history of withdrawal.  Serum ethanol undetectable.  Currently no sign of alcohol withdrawal. ?-Continue CIWA protocol with Ativan as needed ?-Continue thiamine, folate, MVM ?-TOC consult ?

## 2021-05-15 ENCOUNTER — Encounter (HOSPITAL_COMMUNITY): Payer: Self-pay | Admitting: Orthopaedic Surgery

## 2021-05-15 ENCOUNTER — Inpatient Hospital Stay (HOSPITAL_COMMUNITY): Payer: No Typology Code available for payment source

## 2021-05-15 ENCOUNTER — Inpatient Hospital Stay (HOSPITAL_COMMUNITY): Payer: No Typology Code available for payment source | Admitting: Certified Registered"

## 2021-05-15 ENCOUNTER — Encounter (HOSPITAL_COMMUNITY)
Admission: EM | Disposition: A | Discharge status: Home / Self Care | Payer: Self-pay | Source: Home / Self Care | Attending: Orthopedic Surgery

## 2021-05-15 ENCOUNTER — Other Ambulatory Visit: Payer: Self-pay

## 2021-05-15 DIAGNOSIS — S52251A Displaced comminuted fracture of shaft of ulna, right arm, initial encounter for closed fracture: Secondary | ICD-10-CM

## 2021-05-15 DIAGNOSIS — S52201A Unspecified fracture of shaft of right ulna, initial encounter for closed fracture: Secondary | ICD-10-CM

## 2021-05-15 DIAGNOSIS — S82892A Other fracture of left lower leg, initial encounter for closed fracture: Secondary | ICD-10-CM

## 2021-05-15 DIAGNOSIS — S8255XA Nondisplaced fracture of medial malleolus of left tibia, initial encounter for closed fracture: Secondary | ICD-10-CM

## 2021-05-15 HISTORY — PX: ORIF ANKLE FRACTURE: SHX5408

## 2021-05-15 HISTORY — PX: ORIF ULNAR FRACTURE: SHX5417

## 2021-05-15 SURGERY — OPEN REDUCTION INTERNAL FIXATION (ORIF) ULNAR FRACTURE
Anesthesia: General | Site: Arm Lower | Laterality: Right

## 2021-05-15 MED ORDER — CEFAZOLIN SODIUM-DEXTROSE 2-4 GM/100ML-% IV SOLN
INTRAVENOUS | Status: AC
  Filled 2021-05-15: qty 100

## 2021-05-15 MED ORDER — METHOCARBAMOL 1000 MG/10ML IJ SOLN: 500.0000 mg | Freq: Four times a day (QID) | INTRAVENOUS | Status: DC | PRN

## 2021-05-15 MED ORDER — PROPOFOL 10 MG/ML IV BOLUS
INTRAVENOUS | Status: DC | PRN
  Administered 2021-05-15: 160 mg via INTRAVENOUS

## 2021-05-15 MED ORDER — CEFAZOLIN SODIUM-DEXTROSE 2-3 GM-%(50ML) IV SOLR
INTRAVENOUS | Status: DC | PRN
  Administered 2021-05-15: 2 g via INTRAVENOUS

## 2021-05-15 MED ORDER — ENOXAPARIN SODIUM 40 MG/0.4ML IJ SOSY
40.0000 mg | PREFILLED_SYRINGE | INTRAMUSCULAR | Status: DC
Start: 2021-05-16 — End: 2021-05-16
  Filled 2021-05-15: qty 0.4

## 2021-05-15 MED ORDER — METOCLOPRAMIDE HCL 5 MG/ML IJ SOLN: 5.0000 mg | Freq: Three times a day (TID) | INTRAMUSCULAR | Status: DC | PRN

## 2021-05-15 MED ORDER — PANTOPRAZOLE SODIUM 40 MG PO TBEC
40.0000 mg | DELAYED_RELEASE_TABLET | Freq: Every day | ORAL | Status: DC
  Administered 2021-05-16 – 2021-05-19 (×4): 40 mg via ORAL
  Filled 2021-05-15 (×4): qty 1

## 2021-05-15 MED ORDER — ONDANSETRON HCL 4 MG/2ML IJ SOLN
INTRAMUSCULAR | Status: AC
  Filled 2021-05-15: qty 2

## 2021-05-15 MED ORDER — OXYCODONE HCL 5 MG PO TABS
5.0000 mg | ORAL_TABLET | ORAL | Status: DC | PRN
  Administered 2021-05-18: 10 mg via ORAL
  Filled 2021-05-15: qty 2

## 2021-05-15 MED ORDER — CHLORHEXIDINE GLUCONATE 0.12 % MT SOLN: 15.0000 mL | Freq: Once | OROMUCOSAL | Status: AC

## 2021-05-15 MED ORDER — FENTANYL CITRATE (PF) 100 MCG/2ML IJ SOLN
INTRAMUSCULAR | Status: DC | PRN
Start: 2021-05-15 — End: 2021-05-15
  Administered 2021-05-15: 100 ug via INTRAVENOUS
  Administered 2021-05-15: 50 ug via INTRAVENOUS
  Administered 2021-05-15: 100 ug via INTRAVENOUS

## 2021-05-15 MED ORDER — ACETAMINOPHEN 325 MG PO TABS: 325.0000 mg | ORAL_TABLET | Freq: Four times a day (QID) | ORAL | Status: DC | PRN

## 2021-05-15 MED ORDER — DROPERIDOL 2.5 MG/ML IJ SOLN: 0.6250 mg | Freq: Once | INTRAMUSCULAR | Status: DC | PRN

## 2021-05-15 MED ORDER — CEFAZOLIN SODIUM-DEXTROSE 1-4 GM/50ML-% IV SOLN
1.0000 g | Freq: Four times a day (QID) | INTRAVENOUS | Status: AC
  Administered 2021-05-15 – 2021-05-16 (×3): 1 g via INTRAVENOUS
  Filled 2021-05-15 (×3): qty 50

## 2021-05-15 MED ORDER — LACTATED RINGERS IV SOLN: INTRAVENOUS | Status: DC

## 2021-05-15 MED ORDER — CHLORHEXIDINE GLUCONATE 0.12 % MT SOLN
OROMUCOSAL | Status: AC
  Administered 2021-05-15: 15 mL via OROMUCOSAL
  Filled 2021-05-15: qty 15

## 2021-05-15 MED ORDER — MAGNESIUM HYDROXIDE 400 MG/5ML PO SUSP
30.0000 mL | Freq: Every day | ORAL | Status: DC | PRN
  Filled 2021-05-15: qty 30

## 2021-05-15 MED ORDER — ONDANSETRON HCL 4 MG/2ML IJ SOLN
4.0000 mg | Freq: Four times a day (QID) | INTRAMUSCULAR | Status: DC | PRN
Start: 2021-05-15 — End: 2021-05-15

## 2021-05-15 MED ORDER — HYDROMORPHONE HCL 1 MG/ML IJ SOLN: 0.5000 mg | INTRAMUSCULAR | Status: DC | PRN

## 2021-05-15 MED ORDER — FENTANYL CITRATE (PF) 250 MCG/5ML IJ SOLN
INTRAMUSCULAR | Status: AC
  Filled 2021-05-15: qty 5

## 2021-05-15 MED ORDER — METHOCARBAMOL 500 MG PO TABS: 500.0000 mg | ORAL_TABLET | Freq: Four times a day (QID) | ORAL | Status: DC | PRN

## 2021-05-15 MED ORDER — ACETAMINOPHEN 500 MG PO TABS
1000.0000 mg | ORAL_TABLET | Freq: Four times a day (QID) | ORAL | Status: DC
Start: 2021-05-16 — End: 2021-05-19
  Administered 2021-05-15 – 2021-05-19 (×9): 1000 mg via ORAL
  Filled 2021-05-15 (×10): qty 2

## 2021-05-15 MED ORDER — ONDANSETRON HCL 4 MG PO TABS
4.0000 mg | ORAL_TABLET | Freq: Four times a day (QID) | ORAL | Status: DC | PRN
Start: 2021-05-15 — End: 2021-05-15

## 2021-05-15 MED ORDER — DEXAMETHASONE SODIUM PHOSPHATE 10 MG/ML IJ SOLN
INTRAMUSCULAR | Status: DC | PRN
  Administered 2021-05-15: 5 mg via INTRAVENOUS

## 2021-05-15 MED ORDER — ORAL CARE MOUTH RINSE: 15.0000 mL | Freq: Once | OROMUCOSAL | Status: AC

## 2021-05-15 MED ORDER — SUGAMMADEX SODIUM 200 MG/2ML IV SOLN
INTRAVENOUS | Status: DC | PRN
  Administered 2021-05-15: 300 mg via INTRAVENOUS

## 2021-05-15 MED ORDER — ROCURONIUM BROMIDE 10 MG/ML (PF) SYRINGE
PREFILLED_SYRINGE | INTRAVENOUS | Status: DC | PRN
  Administered 2021-05-15: 50 mg via INTRAVENOUS

## 2021-05-15 MED ORDER — OXYCODONE HCL 5 MG PO TABS
10.0000 mg | ORAL_TABLET | ORAL | Status: DC | PRN
  Administered 2021-05-15 – 2021-05-16 (×4): 15 mg via ORAL
  Administered 2021-05-16: 10 mg via ORAL
  Administered 2021-05-16 – 2021-05-17 (×3): 15 mg via ORAL
  Filled 2021-05-15 (×5): qty 3
  Filled 2021-05-15: qty 2
  Filled 2021-05-15 (×2): qty 3

## 2021-05-15 MED ORDER — MIDAZOLAM HCL 2 MG/2ML IJ SOLN
INTRAMUSCULAR | Status: DC | PRN
  Administered 2021-05-15: 2 mg via INTRAVENOUS

## 2021-05-15 MED ORDER — DEXAMETHASONE SODIUM PHOSPHATE 10 MG/ML IJ SOLN
INTRAMUSCULAR | Status: AC
  Filled 2021-05-15: qty 1

## 2021-05-15 MED ORDER — LIDOCAINE 2% (20 MG/ML) 5 ML SYRINGE
INTRAMUSCULAR | Status: DC | PRN
  Administered 2021-05-15: 50 mg via INTRAVENOUS

## 2021-05-15 MED ORDER — POTASSIUM CHLORIDE IN NACL 20-0.9 MEQ/L-% IV SOLN
INTRAVENOUS | Status: DC
  Filled 2021-05-15: qty 1000

## 2021-05-15 MED ORDER — SORBITOL 70 % SOLN
30.0000 mL | Freq: Every day | Status: DC | PRN
  Filled 2021-05-15: qty 30

## 2021-05-15 MED ORDER — MIDAZOLAM HCL 2 MG/2ML IJ SOLN
INTRAMUSCULAR | Status: AC
  Filled 2021-05-15: qty 2

## 2021-05-15 MED ORDER — METOCLOPRAMIDE HCL 5 MG PO TABS: 5.0000 mg | ORAL_TABLET | Freq: Three times a day (TID) | ORAL | Status: DC | PRN

## 2021-05-15 MED ORDER — HYDROMORPHONE HCL 1 MG/ML IJ SOLN: 0.2500 mg | INTRAMUSCULAR | Status: DC | PRN

## 2021-05-15 MED ORDER — ONDANSETRON HCL 4 MG/2ML IJ SOLN
INTRAMUSCULAR | Status: DC | PRN
  Administered 2021-05-15: 4 mg via INTRAVENOUS

## 2021-05-15 MED ORDER — 0.9 % SODIUM CHLORIDE (POUR BTL) OPTIME
TOPICAL | Status: DC | PRN
  Administered 2021-05-15: 1000 mL

## 2021-05-15 MED ORDER — DOCUSATE SODIUM 100 MG PO CAPS: 100.0000 mg | ORAL_CAPSULE | Freq: Two times a day (BID) | ORAL | Status: DC

## 2021-05-15 SURGICAL SUPPLY — 96 items
BAG COUNTER SPONGE SURGICOUNT (BAG) ×3 IMPLANT
BAG SPNG CNTER NS LX DISP (BAG) ×2
BANDAGE ESMARK 6X9 LF (GAUZE/BANDAGES/DRESSINGS) ×2 IMPLANT
BIT DRILL CANN QC 3.5/4.0 2.7 (BIT) ×1 IMPLANT
BIT DRILL L45 QC 2.7X125 (BIT) IMPLANT
BIT DRILL LCP QC 2X140 (BIT) ×1 IMPLANT
BIT DRILL QC 2.5X135 (BIT) ×1 IMPLANT
BIT DRILL QC 2.7X125 (BIT) ×3
BLADE CLIPPER SURG (BLADE) ×3 IMPLANT
BNDG CMPR 9X4 STRL LF SNTH (GAUZE/BANDAGES/DRESSINGS) ×2
BNDG CMPR 9X6 STRL LF SNTH (GAUZE/BANDAGES/DRESSINGS) ×2
BNDG ELASTIC 3X5.8 VLCR STR LF (GAUZE/BANDAGES/DRESSINGS) ×1 IMPLANT
BNDG ELASTIC 4X5.8 VLCR STR LF (GAUZE/BANDAGES/DRESSINGS) ×5 IMPLANT
BNDG ELASTIC 6X5.8 VLCR STR LF (GAUZE/BANDAGES/DRESSINGS) IMPLANT
BNDG ESMARK 4X9 LF (GAUZE/BANDAGES/DRESSINGS) ×3 IMPLANT
BNDG ESMARK 6X9 LF (GAUZE/BANDAGES/DRESSINGS) ×3
BNDG GAUZE ELAST 4 BULKY (GAUZE/BANDAGES/DRESSINGS) ×6 IMPLANT
BRUSH SCRUB EZ PLAIN DRY (MISCELLANEOUS) ×6 IMPLANT
COVER MAYO STAND STRL (DRAPES) ×3 IMPLANT
COVER SURGICAL LIGHT HANDLE (MISCELLANEOUS) ×3 IMPLANT
CUFF TOURN SGL QUICK 34 (TOURNIQUET CUFF) ×3
CUFF TRNQT CYL 34X4.125X (TOURNIQUET CUFF) ×2 IMPLANT
DECANTER SPIKE VIAL GLASS SM (MISCELLANEOUS) IMPLANT
DRAPE C-ARM 42X72 X-RAY (DRAPES) ×3 IMPLANT
DRAPE C-ARMOR (DRAPES) ×3 IMPLANT
DRAPE HALF SHEET 40X57 (DRAPES) ×3 IMPLANT
DRAPE U-SHAPE 47X51 STRL (DRAPES) ×3 IMPLANT
DRSG EMULSION OIL 3X3 NADH (GAUZE/BANDAGES/DRESSINGS) IMPLANT
DRSG MEPITEL 4X7.2 (GAUZE/BANDAGES/DRESSINGS) ×2 IMPLANT
ELECT REM PT RETURN 9FT ADLT (ELECTROSURGICAL) ×3
ELECTRODE REM PT RTRN 9FT ADLT (ELECTROSURGICAL) ×2 IMPLANT
EXTRACTOR BROKEN SCREW 2 (INSTRUMENTS) ×2 IMPLANT
GAUZE SPONGE 4X4 12PLY STRL (GAUZE/BANDAGES/DRESSINGS) ×6 IMPLANT
GLOVE BIO SURGEON STRL SZ7.5 (GLOVE) ×3 IMPLANT
GLOVE BIO SURGEON STRL SZ8 (GLOVE) ×3 IMPLANT
GLOVE BIOGEL PI IND STRL 7.5 (GLOVE) ×2 IMPLANT
GLOVE BIOGEL PI IND STRL 8 (GLOVE) ×2 IMPLANT
GLOVE BIOGEL PI INDICATOR 7.5 (GLOVE) ×1
GLOVE BIOGEL PI INDICATOR 8 (GLOVE) ×1
GLOVE SURG ORTHO LTX SZ7.5 (GLOVE) ×6 IMPLANT
GOWN STRL REUS W/ TWL LRG LVL3 (GOWN DISPOSABLE) ×4 IMPLANT
GOWN STRL REUS W/ TWL XL LVL3 (GOWN DISPOSABLE) ×2 IMPLANT
GOWN STRL REUS W/TWL LRG LVL3 (GOWN DISPOSABLE) ×6
GOWN STRL REUS W/TWL XL LVL3 (GOWN DISPOSABLE) ×3
GUIDEWIRE TT 3.5/4.0 1.4X150 (WIRE) ×1 IMPLANT
KIT BASIN OR (CUSTOM PROCEDURE TRAY) ×3 IMPLANT
KIT TURNOVER KIT B (KITS) ×3 IMPLANT
MANIFOLD NEPTUNE II (INSTRUMENTS) ×3 IMPLANT
NDL HYPO 21X1.5 SAFETY (NEEDLE) IMPLANT
NDL HYPO 25GX1X1/2 BEV (NEEDLE) IMPLANT
NEEDLE HYPO 21X1.5 SAFETY (NEEDLE) IMPLANT
NEEDLE HYPO 25GX1X1/2 BEV (NEEDLE) IMPLANT
NS IRRIG 1000ML POUR BTL (IV SOLUTION) ×3 IMPLANT
PACK GENERAL/GYN (CUSTOM PROCEDURE TRAY) ×3 IMPLANT
PACK ORTHO EXTREMITY (CUSTOM PROCEDURE TRAY) ×3 IMPLANT
PAD ARMBOARD 7.5X6 YLW CONV (MISCELLANEOUS) ×6 IMPLANT
PAD CAST 3X4 CTTN HI CHSV (CAST SUPPLIES) ×2 IMPLANT
PAD CAST 4YDX4 CTTN HI CHSV (CAST SUPPLIES) IMPLANT
PADDING CAST COTTON 3X4 STRL (CAST SUPPLIES) ×3
PADDING CAST COTTON 4X4 STRL (CAST SUPPLIES) ×6
PADDING CAST COTTON 6X4 STRL (CAST SUPPLIES) ×1 IMPLANT
PLATE T LOCK TIB 2.7 4H (Plate) ×1 IMPLANT
PROS LCP PLATE 9H 124M (Plate) ×3 IMPLANT
PROSTHESIS LCP PLATE 9H 124M (Plate) IMPLANT
SCREW CANN ST 4X30 (Screw) ×1 IMPLANT
SCREW CORTEX 3.5 16MM (Screw) ×3 IMPLANT
SCREW CORTEX 3.5 18MM (Screw) ×2 IMPLANT
SCREW CORTEX 3.5 24MM (Screw) ×2 IMPLANT
SCREW CORTICAL 2.7X38MM BONE (Screw) ×1 IMPLANT
SCREW LOCK CORT ST 3.5X16 (Screw) IMPLANT
SCREW LOCK CORT ST 3.5X18 (Screw) IMPLANT
SCREW LOCK CORT ST 3.5X24 (Screw) IMPLANT
SCREW LOCKING VA 2.7X46 (Screw) ×1 IMPLANT
SCREW LOCKING VA 2.7X48 (Screw) ×1 IMPLANT
SCREW LOCKING VA 2.7X54MM (Screw) ×1 IMPLANT
SCREW METAPHYSEAL 2.7X30 (Screw) ×1 IMPLANT
SCREW METAPHYSEAL 2.7X56 (Screw) ×1 IMPLANT
SCREW SLF TPNG 2.7X26MM META (Screw) ×1 IMPLANT
SCREW THRD STARDRIVE ST 2.7X44 (Screw) ×1 IMPLANT
SPLINT FIBERGLASS 3X12 (CAST SUPPLIES) ×1 IMPLANT
SPONGE T-LAP 18X18 ~~LOC~~+RFID (SPONGE) ×3 IMPLANT
STAPLER VISISTAT 35W (STAPLE) IMPLANT
SUCTION FRAZIER HANDLE 10FR (MISCELLANEOUS) ×3
SUCTION TUBE FRAZIER 10FR DISP (MISCELLANEOUS) ×2 IMPLANT
SUT ETHILON 2 0 FS 18 (SUTURE) ×6 IMPLANT
SUT PDS AB 2-0 CT1 27 (SUTURE) IMPLANT
SUT VIC AB 0 CT1 27 (SUTURE) ×3
SUT VIC AB 0 CT1 27XBRD ANBCTR (SUTURE) IMPLANT
SUT VIC AB 2-0 CT1 27 (SUTURE) ×6
SUT VIC AB 2-0 CT1 TAPERPNT 27 (SUTURE) ×4 IMPLANT
SYR CONTROL 10ML LL (SYRINGE) IMPLANT
TOWEL GREEN STERILE (TOWEL DISPOSABLE) ×6 IMPLANT
TOWEL GREEN STERILE FF (TOWEL DISPOSABLE) ×3 IMPLANT
TUBE CONNECTING 12X1/4 (SUCTIONS) ×3 IMPLANT
UNDERPAD 30X36 HEAVY ABSORB (UNDERPADS AND DIAPERS) ×3 IMPLANT
WATER STERILE IRR 1000ML POUR (IV SOLUTION) ×3 IMPLANT
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
## 2021-05-15 NOTE — OR Nursing (Signed)
Care of patient assumed at 1919. 
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
## 2021-05-15 NOTE — Anesthesia Procedure Notes (Signed)
Procedure Name: Intubation ?Date/Time: 05/15/2021 6:03 PM ?Performed by: Moshe Salisbury, CRNA ?Pre-anesthesia Checklist: Patient identified, Emergency Drugs available, Suction available and Patient being monitored ?Patient Re-evaluated:Patient Re-evaluated prior to induction ?Oxygen Delivery Method: Circle System Utilized ?Preoxygenation: Pre-oxygenation with 100% oxygen ?Induction Type: IV induction ?Ventilation: Mask ventilation without difficulty ?Laryngoscope Size: Mac and 4 ?Grade View: Grade I ?Tube type: Oral ?Tube size: 7.5 mm ?Number of attempts: 1 ?Airway Equipment and Method: Stylet ?Placement Confirmation: ETT inserted through vocal cords under direct vision, positive ETCO2 and breath sounds checked- equal and bilateral ?Secured at: 22 cm ?Tube secured with: Tape ?Dental Injury: Teeth and Oropharynx as per pre-operative assessment  ? ? ? ? ?

## 2021-05-15 NOTE — Transfer of Care (Signed)
Immediate Anesthesia Transfer of Care Note ? ?Patient: Nathaniel Hickman ? ?Procedure(s) Performed: RIGHT OPEN REDUCTION INTERNAL FIXATION (ORIF) ULNAR FRACTURE (Right: Arm Lower) ?OPEN REDUCTION INTERNAL FIXATION (ORIF) MALLEOLUS  FRACTURE (Left: Ankle) ? ?Patient Location: PACU ? ?Anesthesia Type:General ? ?Level of Consciousness: awake, alert  and oriented ? ?Airway & Oxygen Therapy: Patient Spontanous Breathing ? ?Post-op Assessment: Report given to RN and Post -op Vital signs reviewed and stable ? ?Post vital signs: Reviewed and stable ? ?Last Vitals:  ?Vitals Value Taken Time  ?BP 147/122 05/15/21 2040  ?Temp    ?Pulse 97 05/15/21 2042  ?Resp 17 05/15/21 2042  ?SpO2 100 % 05/15/21 2042  ?Vitals shown include unvalidated device data. ? ?Last Pain:  ?Vitals:  ? 05/15/21 1630  ?TempSrc: Oral  ?PainSc: 7   ?   ? ?  ? ?Complications: No notable events documented. ?

## 2021-05-15 NOTE — ED Notes (Signed)
Pt requesting breakfast. RN explained to pt about being NPO> pt upset. RN informed pt of possible surgery today, pt states "this is just like yesterday, im hungry" RN once again informed pt of nothing to eat or drink at this time.  ?

## 2021-05-15 NOTE — ED Notes (Signed)
Pt requesting something to drink. RN once again informed pt of NPO status. RN gave pt ice chips. Pt states "what am in going to do with that, you can just throw that away".  ?

## 2021-05-15 NOTE — ED Notes (Signed)
RN messaged PA M. Jeffery with surgery. Pt is still on schedule for surgery apx 4pm  ?
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
## 2021-05-16 ENCOUNTER — Encounter (HOSPITAL_COMMUNITY)
Admission: EM | Disposition: A | Discharge status: Home / Self Care | Payer: Self-pay | Source: Home / Self Care | Attending: Orthopedic Surgery

## 2021-05-16 ENCOUNTER — Encounter (HOSPITAL_COMMUNITY): Payer: Self-pay | Admitting: Orthopedic Surgery

## 2021-05-16 DIAGNOSIS — E871 Hypo-osmolality and hyponatremia: Secondary | ICD-10-CM | POA: Diagnosis present

## 2021-05-16 LAB — CBC WITH DIFFERENTIAL/PLATELET
Abs Immature Granulocytes: 0.05 10*3/uL (ref 0.00–0.07)
Basophils Absolute: 0 10*3/uL (ref 0.0–0.1)
Basophils Relative: 0 %
Eosinophils Absolute: 0 10*3/uL (ref 0.0–0.5)
Eosinophils Relative: 0 %
HCT: 28.8 % — ABNORMAL LOW (ref 39.0–52.0)
Hemoglobin: 10.1 g/dL — ABNORMAL LOW (ref 13.0–17.0)
Immature Granulocytes: 1 %
Lymphocytes Relative: 4 %
Lymphs Abs: 0.4 10*3/uL — ABNORMAL LOW (ref 0.7–4.0)
MCH: 35.3 pg — ABNORMAL HIGH (ref 26.0–34.0)
MCHC: 35.1 g/dL (ref 30.0–36.0)
MCV: 100.7 fL — ABNORMAL HIGH (ref 80.0–100.0)
Monocytes Absolute: 0.3 10*3/uL (ref 0.1–1.0)
Monocytes Relative: 3 %
Neutro Abs: 8.1 10*3/uL — ABNORMAL HIGH (ref 1.7–7.7)
Neutrophils Relative %: 92 %
Platelets: 229 10*3/uL (ref 150–400)
RBC: 2.86 MIL/uL — ABNORMAL LOW (ref 4.22–5.81)
RDW: 12.8 % (ref 11.5–15.5)
WBC: 8.8 10*3/uL (ref 4.0–10.5)
nRBC: 0 % (ref 0.0–0.2)

## 2021-05-16 LAB — COMPREHENSIVE METABOLIC PANEL
ALT: 23 U/L (ref 0–44)
AST: 24 U/L (ref 15–41)
Albumin: 3 g/dL — ABNORMAL LOW (ref 3.5–5.0)
Alkaline Phosphatase: 87 U/L (ref 38–126)
Anion gap: 10 (ref 5–15)
BUN: 9 mg/dL (ref 6–20)
CO2: 21 mmol/L — ABNORMAL LOW (ref 22–32)
Calcium: 8.6 mg/dL — ABNORMAL LOW (ref 8.9–10.3)
Chloride: 100 mmol/L (ref 98–111)
Creatinine, Ser: 0.85 mg/dL (ref 0.61–1.24)
GFR, Estimated: >60 mL/min (ref >60)
Glucose, Bld: 150 mg/dL — ABNORMAL HIGH (ref 70–99)
Potassium: 3.9 mmol/L (ref 3.5–5.1)
Sodium: 131 mmol/L — ABNORMAL LOW (ref 135–145)
Total Bilirubin: 1.2 mg/dL (ref 0.3–1.2)
Total Protein: 6.6 g/dL (ref 6.5–8.1)

## 2021-05-16 LAB — VITAMIN D 25 HYDROXY (VIT D DEFICIENCY, FRACTURES): Vit D, 25-Hydroxy: 22.04 ng/mL — ABNORMAL LOW (ref 30–100)

## 2021-05-16 LAB — RETICULOCYTES
Immature Retic Fract: 20.1 % — ABNORMAL HIGH (ref 2.3–15.9)
RBC.: 2.85 MIL/uL — ABNORMAL LOW (ref 4.22–5.81)
Retic Count, Absolute: 66.7 K/uL (ref 19.0–186.0)
Retic Ct Pct: 2.3 % (ref 0.4–3.1)

## 2021-05-16 LAB — VITAMIN B12: Vitamin B-12: 404 pg/mL (ref 180–914)

## 2021-05-16 LAB — FOLATE: Folate: 19.4 ng/mL

## 2021-05-16 LAB — IRON AND TIBC
Iron: 16 ug/dL — ABNORMAL LOW (ref 45–182)
Saturation Ratios: 5 % — ABNORMAL LOW (ref 17.9–39.5)
TIBC: 293 ug/dL (ref 250–450)
UIBC: 277 ug/dL

## 2021-05-16 LAB — FERRITIN: Ferritin: 204 ng/mL (ref 24–336)

## 2021-05-16 SURGERY — OPEN REDUCTION INTERNAL FIXATION (ORIF) ANKLE FRACTURE
Anesthesia: General | Site: Ankle | Laterality: Right

## 2021-05-16 MED ORDER — METHOCARBAMOL 1000 MG/10ML IJ SOLN
500.0000 mg | Freq: Four times a day (QID) | INTRAMUSCULAR | Status: DC
  Filled 2021-05-16: qty 5

## 2021-05-16 MED ORDER — RIVAROXABAN 10 MG PO TABS
10.0000 mg | ORAL_TABLET | Freq: Every day | ORAL | Status: DC
  Administered 2021-05-16 – 2021-05-19 (×4): 10 mg via ORAL
  Filled 2021-05-16 (×4): qty 1

## 2021-05-16 MED ORDER — METHOCARBAMOL 500 MG PO TABS
500.0000 mg | ORAL_TABLET | Freq: Four times a day (QID) | ORAL | Status: DC
  Administered 2021-05-16 – 2021-05-19 (×11): 500 mg via ORAL
  Filled 2021-05-16 (×11): qty 1
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
## 2021-05-17 LAB — CBC
HCT: 26 % — ABNORMAL LOW (ref 39.0–52.0)
Hemoglobin: 8.8 g/dL — ABNORMAL LOW (ref 13.0–17.0)
MCH: 34.4 pg — ABNORMAL HIGH (ref 26.0–34.0)
MCHC: 33.8 g/dL (ref 30.0–36.0)
MCV: 101.6 fL — ABNORMAL HIGH (ref 80.0–100.0)
Platelets: 215 10*3/uL (ref 150–400)
RBC: 2.56 MIL/uL — ABNORMAL LOW (ref 4.22–5.81)
RDW: 13.2 % (ref 11.5–15.5)
WBC: 6.4 10*3/uL (ref 4.0–10.5)
nRBC: 0 % (ref 0.0–0.2)

## 2021-05-17 LAB — PREALBUMIN: Prealbumin: 15.8 mg/dL — ABNORMAL LOW (ref 18–38)

## 2021-05-17 MED ORDER — VITAMIN D 25 MCG (1000 UNIT) PO TABS
2000.0000 [IU] | ORAL_TABLET | Freq: Two times a day (BID) | ORAL | Status: DC
  Administered 2021-05-17 – 2021-05-19 (×5): 2000 [IU] via ORAL
  Filled 2021-05-17 (×5): qty 2

## 2021-05-17 MED ORDER — HYDROCODONE-ACETAMINOPHEN 10-325 MG PO TABS
1.0000 | ORAL_TABLET | Freq: Four times a day (QID) | ORAL | Status: AC
  Administered 2021-05-17 – 2021-05-18 (×5): 1 via ORAL
  Filled 2021-05-17 (×5): qty 1

## 2021-05-17 NOTE — TOC Progression Note (Addendum)
Transition of Care (TOC) - Progression Note  ? ? ?Patient Details  ?Name: Mainor Hellmann ?MRN: [id number] ?Date of Birth: 03-Feb-1965 ? ?Transition of Care (TOC) CM/SW Contact  ?Durenda Guthrie, RN ?Phone Number: ?05/17/2021, 1:40 PM ? ?Clinical Narrative:   Case Manager has left voice message with [facility] Liaison , Marylene Land, waiting for decision. ? on if the can provide therapy for patient. CM ordered [facility] with right platform from [employer], wheelchair was requested by previous CM.  ? ? ? ?Expected Discharge Plan: Home w Home Health Services ?Barriers to Discharge: Continued Medical Work up ? ?Expected Discharge Plan and Services ?Expected Discharge Plan: Home w Home Health Services ?In-house Referral: Financial Counselor ?  ?  ?  ?                ?DME Arranged: Lightweight manual wheelchair with seat cushion ?DME Agency: [employer] ?Date DME Agency Contacted: 05/16/21 ?Time DME Agency Contacted: 1502 ?Representative spoke with at DME Agency: Leavy Cella ?HH Arranged: PT, OT ?HH Agency: [facility] ?Date HH Agency Contacted: 05/16/21 ?Time HH Agency Contacted: 1503 ?Representative spoke with at [facility] Agency: Victorino Dike ? ? ?Social Determinants of Health (SDOH) Interventions ?  ? ?Readmission Risk Interventions ?   ? View : No data to display.  ?  ?  ?  ? ? ?
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
## 2021-05-17 NOTE — Progress Notes (Signed)
Mobility Specialist: Progress Note ? ? 05/17/21 1654  ?Mobility  ?Activity Ambulated with assistance in room  ?Level of Assistance Contact guard assist, steadying assist  ?Assistive Device Other (Comment) ?(Platofrm Walker)  ?RUE Weight Bearing  ?(Weight bearing through elbow only)  ?LLE Weight Bearing NWB  ?Distance Ambulated (ft) 44 ft  ?Activity Response Tolerated well  ?$Mobility charge 1 Mobility  ? ?Pt received in bed and agreeable to in room ambulation. C/o LLE pain upon standing, otherwise asymptomatic. Verbal cues to maintain LLE NWB throughout session. Pt back to bed after session with call bell and phone in reach. Bed alarm is on.  ? ?Nathaniel Hickman ?Mobility Specialist ?Mobility Specialist 5 North: [phone] ?Mobility Specialist 6 North: [phone] ? ?
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
## 2021-05-17 NOTE — Progress Notes (Signed)
Pt upset about being bothered. Told pt to call if he needs anything. Rescheduled his AM labs for a later time.  ?

## 2021-05-17 NOTE — Plan of Care (Signed)
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
## 2021-05-17 NOTE — Progress Notes (Signed)
Pt is requesting that IV be removed. Education given to pt about keeping IV for emergency purposes. Pt is still requesting that it be removed. On call MD is alerted.  ?

## 2021-05-18 ENCOUNTER — Other Ambulatory Visit (HOSPITAL_COMMUNITY): Payer: Self-pay

## 2021-05-18 LAB — BASIC METABOLIC PANEL
Anion gap: 6 (ref 5–15)
BUN: 6 mg/dL (ref 6–20)
CO2: 28 mmol/L (ref 22–32)
Calcium: 9.1 mg/dL (ref 8.9–10.3)
Chloride: 100 mmol/L (ref 98–111)
Creatinine, Ser: 0.7 mg/dL (ref 0.61–1.24)
GFR, Estimated: >60 mL/min (ref >60)
Glucose, Bld: 110 mg/dL — ABNORMAL HIGH (ref 70–99)
Potassium: 3.7 mmol/L (ref 3.5–5.1)
Sodium: 134 mmol/L — ABNORMAL LOW (ref 135–145)

## 2021-05-18 MED ORDER — ASCORBIC ACID 1000 MG PO TABS
1000.0000 mg | ORAL_TABLET | Freq: Every day | ORAL | 1 refills | Status: AC
  Filled 2021-05-18: qty 30, 30d supply, fill #0

## 2021-05-18 MED ORDER — METHOCARBAMOL 500 MG PO TABS
500.0000 mg | ORAL_TABLET | Freq: Four times a day (QID) | ORAL | 0 refills | Status: AC | PRN
  Filled 2021-05-18: qty 60, 8d supply, fill #0

## 2021-05-18 MED ORDER — DOCUSATE SODIUM 100 MG PO CAPS
100.0000 mg | ORAL_CAPSULE | Freq: Two times a day (BID) | ORAL | 0 refills | Status: AC
  Filled 2021-05-18: qty 10, 5d supply, fill #0

## 2021-05-18 MED ORDER — ZINC SULFATE 220 (50 ZN) MG PO CAPS
220.0000 mg | ORAL_CAPSULE | Freq: Every day | ORAL | Status: DC
  Administered 2021-05-18 – 2021-05-19 (×2): 220 mg via ORAL
  Filled 2021-05-18 (×2): qty 1

## 2021-05-18 MED ORDER — HYDROCODONE-ACETAMINOPHEN 10-325 MG PO TABS
1.0000 | ORAL_TABLET | Freq: Four times a day (QID) | ORAL | 0 refills | Status: DC | PRN
  Filled 2021-05-18: qty 40, 10d supply, fill #0

## 2021-05-18 MED ORDER — RIVAROXABAN 15 MG PO TABS
15.0000 mg | ORAL_TABLET | Freq: Every day | ORAL | 0 refills | Status: DC
  Filled 2021-05-18: qty 30, 30d supply, fill #0

## 2021-05-18 MED ORDER — ASCORBIC ACID 500 MG PO TABS
1000.0000 mg | ORAL_TABLET | Freq: Every day | ORAL | Status: DC
  Administered 2021-05-18 – 2021-05-19 (×2): 1000 mg via ORAL
  Filled 2021-05-18 (×2): qty 2

## 2021-05-18 MED ORDER — CERTAVITE/ANTIOXIDANTS PO TABS
1.0000 | ORAL_TABLET | Freq: Every day | ORAL | 1 refills | Status: AC
Start: 2021-05-19 — End: 2021-07-18
  Filled 2021-05-18: qty 30, 30d supply, fill #0

## 2021-05-18 MED ORDER — ACETAMINOPHEN 500 MG PO TABS
1000.0000 mg | ORAL_TABLET | Freq: Four times a day (QID) | ORAL | 0 refills | Status: AC
  Filled 2021-05-18: qty 30, 4d supply, fill #0

## 2021-05-18 MED ORDER — ZINC SULFATE 220 (50 ZN) MG PO TABS
220.0000 mg | ORAL_TABLET | Freq: Every day | ORAL | 1 refills | Status: AC
  Filled 2021-05-18: qty 30, 30d supply, fill #0

## 2021-05-18 MED ORDER — CHOLECALCIFEROL 125 MCG (5000 UT) PO TABS
ORAL_TABLET | Freq: Every day | ORAL | 6 refills | Status: AC
  Filled 2021-05-18: qty 30, 30d supply, fill #0
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
## 2021-05-18 NOTE — Progress Notes (Signed)
OT Cancellation Note ? ?Patient Details ?Name: Nathaniel Hickman ?MRN: [id number] ?DOB: 1965-09-20 ? ? ?Cancelled Treatment:    Reason Eval/Treat Not Completed: Patient declined, no reason specified. Pt refused, stating he has not gotten his breakfast yet, and will not work with anyone until his breakfast is here. OT will follow up as time allows.  ? ?Demian Maisel Elane Bing Plume ?05/18/2021, 9:14 AM ?

## 2021-05-18 NOTE — Progress Notes (Signed)
Mobility Specialist: Progress Note ? ? 05/18/21 1551  ?Mobility  ?Activity Ambulated with assistance in room  ?Level of Assistance Contact guard assist, steadying assist  ?Assistive Device  ?Investment banker, operational)  ?RUE Weight Bearing Weight bear through elbow only  ?LLE Weight Bearing NWB  ?Distance Ambulated (ft) 44 ft  ?Activity Response Tolerated well  ?$Mobility charge 1 Mobility  ? ?Pt received in bed and agreeable to ambulation. Ambulated in the room, distance limited secondary to fatigue. Pt able to maintain NWB status well today. Back to bed per request with call bell and phone in reach. Pt requesting pain medication, RN notified.  ? ?Cristal Deer Ople Girgis ?Mobility Specialist ?Mobility Specialist 5 [address]: [phone] ?Mobility Specialist 6 [address]: [phone] ? ?

## 2021-05-18 NOTE — Progress Notes (Signed)
Pt requested that bed alarm turned off. Education given. Pt states that he understands.  ?

## 2021-05-19 NOTE — TOC Transition Note (Signed)
Transition of Care (TOC) - CM/SW Discharge Note ? ? ?Patient Details  ?Name: Nathaniel Hickman ?MRN: [id number] ?Date of Birth: October 02, 1965 ? ?Transition of Care (TOC) CM/SW Contact:  ?[place], LCSWA ?Phone Number: ?05/19/2021, 10:29 AM ? ? ?Clinical Narrative:    ? ?SW informed by Judson Roch, PA, pt needing assistance with transportation home.  ? ?Cab voucher left at nursing station ? ?  ?Barriers to Discharge: Continued Medical Work up ? ? ?Patient Goals and CMS Choice ?  ?  ?Choice offered to / list presented to : Patient ? ?Discharge Placement ?  ?           ?  ?  ?  ?  ? ?Discharge Plan and Services ?In-house Referral: Financial Counselor ?Discharge Planning Services: CM Consult ?           ?DME Arranged: Geophysicist/field seismologist, Walker rolling ?DME Agency: [facility] ?Date DME Agency Contacted: 05/17/21 ?Time DME Agency Contacted: [id number] ?Representative spoke with at DME Agency: [place] ?HH Arranged: PT, OT ?[place] Agency: [facility] ?Date HH Agency Contacted: 05/16/21 ?Time [place]: 1503 ?Representative spoke with at [place]: Anderson Malta ? ?Social Determinants of Health (SDOH) Interventions ?  ? ? ?Readmission Risk Interventions ?   ? View : No data to display.  ?  ?  ?  ? ? ? ? ? ?

## 2021-05-19 NOTE — Progress Notes (Signed)
Pt's meds were picked up from the pharmacy. Pt's discharge papers were reviewed with the patient. Pt doesn't have any questions.  ?
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
## 2021-05-19 NOTE — Progress Notes (Signed)
Orthopaedic Trauma Progress Note ? ?SUBJECTIVE: Doing okay this morning, notes that he is sore but overall pain has been well controlled.  No chest pain. No SOB. No nausea/vomiting. No other complaints.  Stable for discharge ? ?OBJECTIVE:  ?Vitals:  ? 05/18/21 2006 05/19/21 8144  ?BP: 122/84 128/90  ?Pulse: 66 66  ?Resp: 16 17  ?Temp: 98.2 ?F (36.8 ?C) 98.6 ?F (37 ?C)  ?SpO2: 100% 100%  ? ? ?General: Laying in bed comfortably, no acute distress ?Respiratory: No increased work of breathing.  ?Right upper extremity: Splint in place, dressing clean, dry, intact.  Able to wiggle fingers.  Nontender above splint.  Full painless elbow range of motion.  Fingers warm and well-perfused  ?Left lower extremity: Dressing clean, dry, intact.  Boot in place.Tenderness about the ankle. Extremity is warm. DPN, SPN, TN sensory function intact. EHL, FHL, lesser toe motor function intact. Compartments are soft. No pain out of proportion with passive stretching of his toes ? ?IMAGING: Stable post op imaging.  ? ?LABS: No results found for this or any previous visit (from the past 24 hour(s)). ? ?ASSESSMENT: Nathaniel Hickman is a 56 y.o. male, 4 Days Post-Op s/p Procedure(s): ?RIGHT OPEN REDUCTION INTERNAL FIXATION (ORIF) ULNAR FRACTURE ?OPEN REDUCTION INTERNAL FIXATION (ORIF) MALLEOLUS  FRACTURE ? ?CV/Blood loss: Acute blood loss anemia, Hgb 8.8 on 05/17/21. Hemodynamically stable ? ?PLAN: ?Weightbearing: NWB RUE and LLE. Can weight-bear through right elbow using a platform walker ?ROM:  ?- RUE: unrestricted motion of elbow and digits ?- LLE: Okay to come out of boot at rest for gentle ankle motion ?Incisional and dressing care:  ?- RUE: Leave dressing in place until follow-up ?- LLE: Okay to change on daily basis and clean wounds with soap and water ?Showering: Okay to shower, keep R UE dressing clean and dry ?Orthopedic device(s): CAM boot LLE.  Splint RUE ?Pain management: Continue current regimen ?1. Tylenol 650 mg q 6 hours  scheduled ?VTE prophylaxis: Xarelto, SCDs ?ID: Perioperative antibiotics completed ?Foley/Lines:  No foley, KVO IVFs ?Impediments to Fracture Healing: Poor nutrition.  Vitamin D deficiency, started on supplementation ?Dispo: Patient stable for discharge.  Does not currently have a ride, will need a taxi voucher.   ? ?Follow - up plan: 2 weeks with Dr. [name] for wound check, repeat x-rays, splint removal ? ? ?Contact information:  Truitt Merle MD, Thyra Breed PA-C. After hours and holidays please check [email or web address] for group call information for [facility] ? ? ?Thompson Caul, PA-C ?[phone] (office) ?[email or web address] ? ? ? ?

## 2021-05-19 NOTE — Plan of Care (Signed)
?  Problem: Activity: ?Goal: Risk for activity intolerance will decrease ?Outcome: Adequate for Discharge ?  ?Problem: Coping: ?Goal: Level of anxiety will decrease ?Outcome: Adequate for Discharge ?  ?Problem: Elimination: ?Goal: Will not experience complications related to bowel motility ?Outcome: Adequate for Discharge ?Goal: Will not experience complications related to urinary retention ?Outcome: Adequate for Discharge ?  ?Problem: Safety: ?Goal: Ability to remain free from injury will improve ?Outcome: Adequate for Discharge ?  ?

## 2021-05-20 NOTE — Anesthesia Postprocedure Evaluation (Signed)
Anesthesia Post Note ? ?Patient: Karter Hellmer ? ?Procedure(s) Performed: RIGHT OPEN REDUCTION INTERNAL FIXATION (ORIF) ULNAR FRACTURE (Right: Arm Lower) ?OPEN REDUCTION INTERNAL FIXATION (ORIF) MALLEOLUS  FRACTURE (Left: Ankle) ? ?  ? ?Patient location during evaluation: PACU ?Anesthesia Type: General ?Level of consciousness: patient cooperative and awake ?Pain management: pain level controlled ?Vital Signs Assessment: post-procedure vital signs reviewed and stable ?Respiratory status: spontaneous breathing, nonlabored ventilation, respiratory function stable and patient connected to nasal cannula oxygen ?Cardiovascular status: blood pressure returned to baseline and stable ?Postop Assessment: no apparent nausea or vomiting ?Anesthetic complications: no ? ? ?No notable events documented. ? ?Last Vitals:  ?Vitals:  ? 05/18/21 2006 05/19/21 1941  ?BP: 122/84 128/90  ?Pulse: 66 66  ?Resp: 16 17  ?Temp: 36.8 ?C 37 ?C  ?SpO2: 100% 100%  ?  ?Last Pain:  ?Vitals:  ? 05/19/21 0828  ?TempSrc: Oral  ?PainSc:   ? ? ?  ?  ?  ?  ?  ?  ? ?Zakiah Beckerman ? ? ? ? ?

## 2021-05-21 ENCOUNTER — Encounter (HOSPITAL_COMMUNITY): Payer: Self-pay | Admitting: Emergency Medicine

## 2021-05-21 ENCOUNTER — Other Ambulatory Visit: Payer: Self-pay

## 2021-05-21 ENCOUNTER — Emergency Department (HOSPITAL_COMMUNITY)
Admission: EM | Admit: 2021-05-21 | Discharge: 2021-05-21 | Discharge status: Against Medical Advice | Payer: Self-pay | Attending: Emergency Medicine | Admitting: Emergency Medicine

## 2021-05-21 DIAGNOSIS — R52 Pain, unspecified: Secondary | ICD-10-CM | POA: Insufficient documentation

## 2021-05-21 DIAGNOSIS — Z5321 Procedure and treatment not carried out due to patient leaving prior to being seen by health care provider: Secondary | ICD-10-CM | POA: Insufficient documentation

## 2021-05-21 NOTE — ED Triage Notes (Signed)
Patient coming from home, states he was told when discharged to come back here today to follow up, states he is having pain, taking pain medications that were prescribed to him, states when they wear off his pain comes back. ?
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
## 2021-05-21 NOTE — ED Notes (Signed)
Pt no answer for room. 

## 2021-05-22 ENCOUNTER — Other Ambulatory Visit (HOSPITAL_BASED_OUTPATIENT_CLINIC_OR_DEPARTMENT_OTHER): Payer: Self-pay

## 2021-05-22 ENCOUNTER — Telehealth (HOSPITAL_BASED_OUTPATIENT_CLINIC_OR_DEPARTMENT_OTHER): Payer: Self-pay

## 2021-05-22 NOTE — Telephone Encounter (Signed)
Transitions of Care Pharmacy  ? ?Call attempted for a pharmacy transitions of care follow-up.   ? ?Call attempt #1. Will follow-up in 2-3 days.  ?  ?Darcus Austin, PharmD ?Clinical Pharmacist ?[facility] ?05/22/2021 3:57 PM ? ?

## 2021-05-27 ENCOUNTER — Emergency Department (HOSPITAL_COMMUNITY): Payer: Self-pay

## 2021-05-27 ENCOUNTER — Other Ambulatory Visit: Payer: Self-pay

## 2021-05-27 ENCOUNTER — Encounter (HOSPITAL_COMMUNITY): Payer: Self-pay | Admitting: Emergency Medicine

## 2021-05-27 ENCOUNTER — Emergency Department (HOSPITAL_COMMUNITY)
Admission: EM | Admit: 2021-05-27 | Discharge: 2021-05-27 | Disposition: A | Discharge status: Home / Self Care | Payer: Self-pay | Attending: Emergency Medicine | Admitting: Emergency Medicine

## 2021-05-27 DIAGNOSIS — Z4802 Encounter for removal of sutures: Secondary | ICD-10-CM | POA: Diagnosis present

## 2021-05-27 DIAGNOSIS — Z4789 Encounter for other orthopedic aftercare: Secondary | ICD-10-CM

## 2021-05-27 DIAGNOSIS — Z4689 Encounter for fitting and adjustment of other specified devices: Secondary | ICD-10-CM | POA: Diagnosis not present

## 2021-05-27 DIAGNOSIS — X58XXXD Exposure to other specified factors, subsequent encounter: Secondary | ICD-10-CM | POA: Insufficient documentation

## 2021-05-27 DIAGNOSIS — S91012D Laceration without foreign body, left ankle, subsequent encounter: Secondary | ICD-10-CM | POA: Insufficient documentation

## 2021-05-27 DIAGNOSIS — Z5189 Encounter for other specified aftercare: Secondary | ICD-10-CM

## 2021-05-27 NOTE — ED Notes (Signed)
RN Reviewed discharge instructions w/ pt. Follow up reviewed, pt had no further questions. Pt given bus pass. ?
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
## 2021-05-27 NOTE — ED Triage Notes (Signed)
Pt states he was told to come back for a wound check to R arm.  Splint in place and pt states splint stinks and he also believes he needs to have stiches out that have been in for 2 weeks. ?

## 2021-05-27 NOTE — Progress Notes (Signed)
Orthopedic Tech Progress Note ?Patient Details:  ?Nathaniel Hickman ?01/08/1966 ?[id number] ? ?Existing splint from surgery was cut down and replaced with a long wrist/forearm brace.  ? ?Ortho Devices ?Type of Ortho Device: Velcro wrist forearm splint ?Ortho Device/Splint Location: RUE ?Ortho Device/Splint Interventions: Ordered, Application, Adjustment ?  ?Post Interventions ?Patient Tolerated: Well ?Instructions Provided: Care of device, Adjustment of device ? ?Traylen Eckels Carmine Savoy ?05/27/2021, 4:24 PM ? ?

## 2021-05-27 NOTE — Discharge Instructions (Addendum)
Follow up with Dr. [name] for further care. Call the office to schedule an appointment.  ?

## 2021-05-27 NOTE — ED Provider Notes (Signed)
Transfer of Care Note ?I assumed care of Nathaniel Hickman on 05/27/2021 at 1500 ? ?Briefly, Nathaniel Hickman is a 56 y.o. male who: ?- Care for wound check on the right arm ?-Getting imaging for future surgical planning per Dr. [name] ? ?  ?DG Wrist Complete Right  ?Final Result  ?  ?DG Ankle Complete Left  ?Final Result  ?  ?DG Forearm Right  ?Final Result  ?  ? ?The plan includes: ?- f/u with Xr R Forearm, ankle L and Wrist on R per Dr. [name] for  ? ? ?Please refer to the original provider?s note for additional information regarding the care of Nathaniel Hickman. ? ?### ?Reassessment: ?I personally reassessed the patient: Patient has remained stable throughout the ED stay. ? ?Today's Vitals  ? 05/27/21 1151 05/27/21 1154 05/27/21 1246 05/27/21 1701  ?BP:  123/89 119/81   ?Pulse:  100 (!) 102   ?Resp:  18 17   ?Temp:  98.2 ?F (36.8 ?C) 97.9 ?F (36.6 ?C)   ?TempSrc:  Oral Oral   ?SpO2:  100% 99%   ?PainSc: 8    5   ? ?There is no height or weight on file to calculate BMI. ? ? ?Additional MDM: ?-Left ankle imaging showed Post ORIF of LEFT ankle fractures as above. Post ORIF of distal RIGHT ulnar diaphyseal fracture.  There is tiny avulsion fracture fragment at ulnar styloid.  Right wrist imaging showed surgical changes status post plate and screw fixation of distal ulnar fracture. Fracture lines remain distinct. ?-Dr. Marcello Fennel has reviewed the imaging and states that patient is okay for discharge.  Orthopedic team will schedule follow-up appointment.  Patient is aware.  Strict return precautions been discussed. ?  ?Jari Sportsman, MD ?05/27/21 1810 ? ?  ?Glendora Score, MD ?05/27/21 2332 ? ?

## 2021-05-30 NOTE — Addendum Note (Signed)
Addendum  created 05/30/21 1217 by Dorris Singh, MD  ? Clinical Note Signed  ?  ?

## 2021-06-04 ENCOUNTER — Other Ambulatory Visit: Payer: Self-pay

## 2021-06-04 ENCOUNTER — Telehealth: Payer: Self-pay

## 2021-06-04 NOTE — Telephone Encounter (Signed)
Transitions of Care Pharmacy  ? ?Call attempted for a pharmacy transitions of care follow-up. Patient does not have a valid phone number.  ? ?Call attempt #2.  ?  ?Darcus Austin, PharmD ?Clinical Pharmacist ?[facility] ?06/04/2021 3:45 PM ? ? ?

## 2021-06-05 ENCOUNTER — Emergency Department (HOSPITAL_COMMUNITY): Payer: Self-pay

## 2021-06-05 ENCOUNTER — Encounter (HOSPITAL_COMMUNITY): Payer: Self-pay

## 2021-06-05 ENCOUNTER — Emergency Department (HOSPITAL_COMMUNITY)
Admission: EM | Admit: 2021-06-05 | Discharge: 2021-06-05 | Disposition: A | Discharge status: Home / Self Care | Payer: Self-pay | Attending: Emergency Medicine | Admitting: Emergency Medicine

## 2021-06-05 ENCOUNTER — Other Ambulatory Visit: Payer: Self-pay

## 2021-06-05 DIAGNOSIS — S00531A Contusion of lip, initial encounter: Secondary | ICD-10-CM | POA: Insufficient documentation

## 2021-06-05 DIAGNOSIS — S060X0A Concussion without loss of consciousness, initial encounter: Secondary | ICD-10-CM

## 2021-06-05 DIAGNOSIS — M79602 Pain in left arm: Secondary | ICD-10-CM | POA: Insufficient documentation

## 2021-06-05 DIAGNOSIS — S066X9A Traumatic subarachnoid hemorrhage with loss of consciousness of unspecified duration, initial encounter: Secondary | ICD-10-CM | POA: Insufficient documentation

## 2021-06-05 DIAGNOSIS — S0083XA Contusion of other part of head, initial encounter: Secondary | ICD-10-CM | POA: Insufficient documentation

## 2021-06-05 DIAGNOSIS — Y908 Blood alcohol level of 240 mg/100 ml or more: Secondary | ICD-10-CM | POA: Insufficient documentation

## 2021-06-05 DIAGNOSIS — Z23 Encounter for immunization: Secondary | ICD-10-CM | POA: Insufficient documentation

## 2021-06-05 DIAGNOSIS — Y92521 Bus station as the place of occurrence of the external cause: Secondary | ICD-10-CM | POA: Insufficient documentation

## 2021-06-05 DIAGNOSIS — Z7901 Long term (current) use of anticoagulants: Secondary | ICD-10-CM | POA: Insufficient documentation

## 2021-06-05 LAB — CBC
HCT: 31.7 % — ABNORMAL LOW (ref 39.0–52.0)
Hemoglobin: 11.1 g/dL — ABNORMAL LOW (ref 13.0–17.0)
MCH: 35 pg — ABNORMAL HIGH (ref 26.0–34.0)
MCHC: 35 g/dL (ref 30.0–36.0)
MCV: 100 fL (ref 80.0–100.0)
Platelets: 195 10*3/uL (ref 150–400)
RBC: 3.17 MIL/uL — ABNORMAL LOW (ref 4.22–5.81)
RDW: 13.9 % (ref 11.5–15.5)
WBC: 4 10*3/uL (ref 4.0–10.5)
nRBC: 0 % (ref 0.0–0.2)

## 2021-06-05 LAB — COMPREHENSIVE METABOLIC PANEL
ALT: 27 U/L (ref 0–44)
AST: 36 U/L (ref 15–41)
Albumin: 3.5 g/dL (ref 3.5–5.0)
Alkaline Phosphatase: 103 U/L (ref 38–126)
Anion gap: 10 (ref 5–15)
BUN: 5 mg/dL — ABNORMAL LOW (ref 6–20)
CO2: 22 mmol/L (ref 22–32)
Calcium: 8.8 mg/dL — ABNORMAL LOW (ref 8.9–10.3)
Chloride: 104 mmol/L (ref 98–111)
Creatinine, Ser: 0.61 mg/dL (ref 0.61–1.24)
GFR, Estimated: >60 mL/min (ref >60)
Glucose, Bld: 94 mg/dL (ref 70–99)
Potassium: 3.4 mmol/L — ABNORMAL LOW (ref 3.5–5.1)
Sodium: 136 mmol/L (ref 135–145)
Total Bilirubin: 0.7 mg/dL (ref 0.3–1.2)
Total Protein: 7.4 g/dL (ref 6.5–8.1)

## 2021-06-05 LAB — ETHANOL: Alcohol, Ethyl (B): 284 mg/dL — ABNORMAL HIGH (ref <10)

## 2021-06-05 LAB — SAMPLE TO BLOOD BANK

## 2021-06-05 LAB — URINALYSIS, ROUTINE W REFLEX MICROSCOPIC
Bacteria, UA: NONE SEEN
Bilirubin Urine: NEGATIVE
Glucose, UA: NEGATIVE mg/dL
Ketones, ur: NEGATIVE mg/dL
Leukocytes,Ua: NEGATIVE
Nitrite: NEGATIVE
Protein, ur: NEGATIVE mg/dL
Specific Gravity, Urine: 1.004 — ABNORMAL LOW (ref 1.005–1.030)
pH: 5 (ref 5.0–8.0)

## 2021-06-05 LAB — PROTIME-INR
INR: 1.1 (ref 0.8–1.2)
Prothrombin Time: 14 seconds (ref 11.4–15.2)

## 2021-06-05 LAB — LACTIC ACID, PLASMA: Lactic Acid, Venous: 2.8 mmol/L (ref 0.5–1.9)

## 2021-06-05 MED ORDER — SODIUM CHLORIDE 0.9 % IV BOLUS
500.0000 mL | Freq: Once | INTRAVENOUS | Status: AC
  Administered 2021-06-05: 500 mL via INTRAVENOUS

## 2021-06-05 MED ORDER — FENTANYL CITRATE PF 50 MCG/ML IJ SOSY
50.0000 ug | PREFILLED_SYRINGE | Freq: Once | INTRAMUSCULAR | Status: AC
  Administered 2021-06-05: 50 ug via INTRAVENOUS
  Filled 2021-06-05: qty 1

## 2021-06-05 MED ORDER — ACETAMINOPHEN 325 MG PO TABS
650.0000 mg | ORAL_TABLET | Freq: Once | ORAL | Status: AC
  Administered 2021-06-05: 650 mg via ORAL
  Filled 2021-06-05: qty 2

## 2021-06-05 MED ORDER — LACTATED RINGERS IV BOLUS
1000.0000 mL | Freq: Once | INTRAVENOUS | Status: AC
Start: 2021-06-05 — End: 2021-06-05
  Administered 2021-06-05: 1000 mL via INTRAVENOUS

## 2021-06-05 MED ORDER — TETANUS-DIPHTH-ACELL PERTUSSIS 5-2.5-18.5 LF-MCG/0.5 IM SUSY
0.5000 mL | PREFILLED_SYRINGE | Freq: Once | INTRAMUSCULAR | Status: AC
  Administered 2021-06-05: 0.5 mL via INTRAMUSCULAR
  Filled 2021-06-05: qty 0.5
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
## 2021-06-05 NOTE — ED Notes (Signed)
Notified MD of pt [facility] ?
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
## 2021-06-05 NOTE — ED Notes (Signed)
Pt dressed and ready to go and refusing discharge vitals ?

## 2021-06-17 ENCOUNTER — Encounter (HOSPITAL_COMMUNITY): Payer: Self-pay | Admitting: Emergency Medicine

## 2021-06-17 ENCOUNTER — Other Ambulatory Visit: Payer: Self-pay

## 2021-06-17 ENCOUNTER — Emergency Department (HOSPITAL_COMMUNITY)
Admission: EM | Admit: 2021-06-17 | Discharge: 2021-06-17 | Disposition: A | Discharge status: Home / Self Care | Payer: Self-pay | Attending: Emergency Medicine | Admitting: Emergency Medicine

## 2021-06-17 DIAGNOSIS — Z7901 Long term (current) use of anticoagulants: Secondary | ICD-10-CM | POA: Insufficient documentation

## 2021-06-17 DIAGNOSIS — Z4802 Encounter for removal of sutures: Secondary | ICD-10-CM | POA: Insufficient documentation

## 2021-06-17 DIAGNOSIS — S0101XD Laceration without foreign body of scalp, subsequent encounter: Secondary | ICD-10-CM | POA: Insufficient documentation

## 2021-06-17 DIAGNOSIS — X58XXXD Exposure to other specified factors, subsequent encounter: Secondary | ICD-10-CM | POA: Insufficient documentation

## 2021-06-17 MED ORDER — LIDOCAINE HCL URETHRAL/MUCOSAL 2 % EX GEL
1.0000 "application " | Freq: Once | CUTANEOUS | Status: AC
  Administered 2021-06-17: 1 via TOPICAL
  Filled 2021-06-17: qty 11

## 2021-06-17 NOTE — ED Notes (Signed)
Pt ambulatory to waiting room. Pt verbalized understanding of discharge instructions.   

## 2021-06-17 NOTE — ED Provider Notes (Signed)
[facility] EMERGENCY DEPARTMENT Provider Note   CSN: [id number] Arrival date & time: 06/17/21  0820     History  Chief Complaint  Patient presents with   Suture / Staple Removal    Nathaniel Hickman is a 56 y.o. male.  Patient is a 56 year old male presenting today for staple removal.  He reports approximately 10 days ago he was seen after injuring his head and had the staples placed.  He has had no problems with this and is here just to get them taken out.  The history is provided by the patient.  Suture / Staple Removal      Home Medications Prior to Admission medications   Medication Sig Start Date End Date Taking? Authorizing Provider  acetaminophen (TYLENOL) 500 MG tablet Take 2 tablets (1,000 mg total) by mouth every 6 (six) hours. 05/18/21   Montez Morita, PA-C  ascorbic acid (VITAMIN C) 1000 MG tablet Take 1 tablet (1,000 mg total) by mouth daily. 05/18/21   Montez Morita, PA-C  Cholecalciferol 125 MCG (5000 UT) TABS Take 1 tablet by mouth daily. 05/18/21   Montez Morita, PA-C  docusate sodium (COLACE) 100 MG capsule Take 1 capsule (100 mg total) by mouth 2 (two) times daily. 05/18/21   Montez Morita, PA-C  HYDROcodone-acetaminophen (NORCO) 10-325 MG tablet Take 1 tablet by mouth every 6 (six) hours as needed for moderate pain or severe pain. 05/18/21   Montez Morita, PA-C  methocarbamol (ROBAXIN) 500 MG tablet Take 1-2 tablets (500-1,000 mg total) by mouth every 6 (six) hours as needed for muscle spasms. 05/18/21   Montez Morita, PA-C  Multiple Vitamins-Minerals (CERTAVITE/ANTIOXIDANTS) TABS Take 1 tablet by mouth daily. 05/19/21 07/18/21  Montez Morita, PA-C  Rivaroxaban (XARELTO) 15 MG TABS tablet Take 1 tablet (15 mg total) by mouth daily with supper. 05/18/21 06/17/21  Montez Morita, PA-C  Zinc Sulfate 220 (50 Zn) MG TABS Take 1 tablet (220 mg total) by mouth daily. 05/18/21   Montez Morita, PA-C      Allergies    Patient has no known allergies.    Review of Systems    Review of Systems  Physical Exam Updated Vital Signs Temp 98.5 F (36.9 C) (Oral)  Physical Exam Vitals and nursing note reviewed.  Constitutional:      Appearance: Normal appearance.  HENT:     Head:     Comments: Laceration on the crown of the head seems to be healing well with 5 staples in place Skin:    General: Skin is warm and dry.  Neurological:     Mental Status: He is alert.    ED Results / Procedures / Treatments   Labs (all labs ordered are listed, but only abnormal results are displayed) Labs Reviewed - No data to display  EKG None  Radiology No results found.  Procedures .Suture Removal  Date/Time: 06/17/2021 8:34 AM Performed by: Gwyneth Sprout, MD Authorized by: Gwyneth Sprout, MD   Consent:    Consent obtained:  Verbal   Consent given by:  Patient Location:    Location:  Head/neck   Head/neck location:  Scalp Procedure details:    Wound appearance:  No signs of infection and good wound healing   Number of staples removed:  5 Post-procedure details:    Post-removal:  Dressing applied   Procedure completion:  Tolerated    Medications Ordered in ED Medications - No data to display  ED Course/ Medical Decision Making/ A&P  Medical Decision Making  Patient presenting today with well-healing laceration for staple removal.  5 staples removed.  Localized wound care applied.  Patient discharged home        Final Clinical Impression(s) / ED Diagnoses Final diagnoses:  Encounter for staple removal    Rx / DC Orders ED Discharge Orders     None         Gwyneth Sprout, MD 06/17/21 [phone]

## 2021-06-17 NOTE — ED Triage Notes (Signed)
Pt here for staple removal  

## 2021-10-03 ENCOUNTER — Encounter (HOSPITAL_COMMUNITY): Payer: Self-pay | Admitting: Emergency Medicine

## 2021-10-03 ENCOUNTER — Other Ambulatory Visit: Payer: Self-pay

## 2021-10-03 ENCOUNTER — Emergency Department (HOSPITAL_COMMUNITY): Payer: Self-pay

## 2021-10-03 ENCOUNTER — Inpatient Hospital Stay (HOSPITAL_COMMUNITY)
Admission: EM | Admit: 2021-10-03 | Discharge: 2021-10-07 | DRG: 439 | Disposition: A | Discharge status: Home / Self Care | Payer: Self-pay | Attending: Internal Medicine | Admitting: Internal Medicine

## 2021-10-03 DIAGNOSIS — K8521 Alcohol induced acute pancreatitis with uninfected necrosis: Principal | ICD-10-CM

## 2021-10-03 DIAGNOSIS — D6959 Other secondary thrombocytopenia: Secondary | ICD-10-CM | POA: Diagnosis present

## 2021-10-03 DIAGNOSIS — D696 Thrombocytopenia, unspecified: Secondary | ICD-10-CM

## 2021-10-03 DIAGNOSIS — K760 Fatty (change of) liver, not elsewhere classified: Secondary | ICD-10-CM | POA: Diagnosis present

## 2021-10-03 DIAGNOSIS — R319 Hematuria, unspecified: Secondary | ICD-10-CM | POA: Diagnosis present

## 2021-10-03 DIAGNOSIS — K863 Pseudocyst of pancreas: Secondary | ICD-10-CM | POA: Diagnosis present

## 2021-10-03 DIAGNOSIS — K859 Acute pancreatitis without necrosis or infection, unspecified: Secondary | ICD-10-CM | POA: Diagnosis present

## 2021-10-03 DIAGNOSIS — G8929 Other chronic pain: Secondary | ICD-10-CM

## 2021-10-03 DIAGNOSIS — Z59819 Housing instability, housed unspecified: Secondary | ICD-10-CM

## 2021-10-03 DIAGNOSIS — K709 Alcoholic liver disease, unspecified: Secondary | ICD-10-CM | POA: Diagnosis present

## 2021-10-03 DIAGNOSIS — Z7901 Long term (current) use of anticoagulants: Secondary | ICD-10-CM

## 2021-10-03 DIAGNOSIS — E876 Hypokalemia: Secondary | ICD-10-CM

## 2021-10-03 DIAGNOSIS — K8020 Calculus of gallbladder without cholecystitis without obstruction: Secondary | ICD-10-CM | POA: Diagnosis present

## 2021-10-03 DIAGNOSIS — Z79899 Other long term (current) drug therapy: Secondary | ICD-10-CM

## 2021-10-03 DIAGNOSIS — R112 Nausea with vomiting, unspecified: Secondary | ICD-10-CM

## 2021-10-03 LAB — CBC WITH DIFFERENTIAL/PLATELET
Abs Immature Granulocytes: 0.01 10*3/uL (ref 0.00–0.07)
Basophils Absolute: 0 10*3/uL (ref 0.0–0.1)
Basophils Relative: 1 %
Eosinophils Absolute: 0.2 10*3/uL (ref 0.0–0.5)
Eosinophils Relative: 4 %
HCT: 35.1 % — ABNORMAL LOW (ref 39.0–52.0)
Hemoglobin: 12.2 g/dL — ABNORMAL LOW (ref 13.0–17.0)
Immature Granulocytes: 0 %
Lymphocytes Relative: 30 %
Lymphs Abs: 1.2 10*3/uL (ref 0.7–4.0)
MCH: 35.8 pg — ABNORMAL HIGH (ref 26.0–34.0)
MCHC: 34.8 g/dL (ref 30.0–36.0)
MCV: 102.9 fL — ABNORMAL HIGH (ref 80.0–100.0)
Monocytes Absolute: 0.3 10*3/uL (ref 0.1–1.0)
Monocytes Relative: 7 %
Neutro Abs: 2.2 10*3/uL (ref 1.7–7.7)
Neutrophils Relative %: 58 %
Platelets: 117 10*3/uL — ABNORMAL LOW (ref 150–400)
RBC: 3.41 MIL/uL — ABNORMAL LOW (ref 4.22–5.81)
RDW: 15.1 % (ref 11.5–15.5)
WBC: 3.9 10*3/uL — ABNORMAL LOW (ref 4.0–10.5)
nRBC: 0 % (ref 0.0–0.2)

## 2021-10-03 LAB — COMPREHENSIVE METABOLIC PANEL
ALT: 105 U/L — ABNORMAL HIGH (ref 0–44)
AST: 238 U/L — ABNORMAL HIGH (ref 15–41)
Albumin: 3.6 g/dL (ref 3.5–5.0)
Alkaline Phosphatase: 108 U/L (ref 38–126)
Anion gap: 16 — ABNORMAL HIGH (ref 5–15)
BUN: 5 mg/dL — ABNORMAL LOW (ref 6–20)
CO2: 20 mmol/L — ABNORMAL LOW (ref 22–32)
Calcium: 9.1 mg/dL (ref 8.9–10.3)
Chloride: 99 mmol/L (ref 98–111)
Creatinine, Ser: 0.94 mg/dL (ref 0.61–1.24)
GFR, Estimated: >60 mL/min (ref >60)
Glucose, Bld: 110 mg/dL — ABNORMAL HIGH (ref 70–99)
Potassium: 3.2 mmol/L — ABNORMAL LOW (ref 3.5–5.1)
Sodium: 135 mmol/L (ref 135–145)
Total Bilirubin: 1.2 mg/dL (ref 0.3–1.2)
Total Protein: 7.4 g/dL (ref 6.5–8.1)

## 2021-10-03 LAB — HEMOGLOBIN A1C
Hgb A1c MFr Bld: 5.4 % (ref 4.8–5.6)
Mean Plasma Glucose: 108.28 mg/dL

## 2021-10-03 LAB — LIPASE, BLOOD: Lipase: 203 U/L — ABNORMAL HIGH (ref 11–51)

## 2021-10-03 LAB — TROPONIN I (HIGH SENSITIVITY)
Troponin I (High Sensitivity): 4 ng/L (ref <18)
Troponin I (High Sensitivity): 4 ng/L (ref <18)

## 2021-10-03 LAB — HIV ANTIBODY (ROUTINE TESTING W REFLEX): HIV Screen 4th Generation wRfx: NONREACTIVE

## 2021-10-03 LAB — PROTIME-INR
INR: 1.1 (ref 0.8–1.2)
Prothrombin Time: 14 seconds (ref 11.4–15.2)

## 2021-10-03 MED ORDER — ONDANSETRON HCL 4 MG/2ML IJ SOLN
INTRAMUSCULAR | Status: AC
  Filled 2021-10-03: qty 2

## 2021-10-03 MED ORDER — LACTATED RINGERS IV BOLUS
1000.0000 mL | Freq: Once | INTRAVENOUS | Status: AC
  Administered 2021-10-03: 1000 mL via INTRAVENOUS

## 2021-10-03 MED ORDER — POTASSIUM CHLORIDE 10 MEQ/100ML IV SOLN
10.0000 meq | INTRAVENOUS | Status: AC
  Administered 2021-10-03 (×5): 10 meq via INTRAVENOUS
  Filled 2021-10-03 (×5): qty 100

## 2021-10-03 MED ORDER — HYDROMORPHONE HCL 1 MG/ML IJ SOLN
0.5000 mg | INTRAMUSCULAR | Status: DC | PRN
  Administered 2021-10-03 (×2): 1 mg via INTRAVENOUS
  Filled 2021-10-03 (×2): qty 1

## 2021-10-03 MED ORDER — FENTANYL CITRATE PF 50 MCG/ML IJ SOSY
50.0000 ug | PREFILLED_SYRINGE | Freq: Once | INTRAMUSCULAR | Status: AC
Start: 2021-10-03 — End: 2021-10-03
  Administered 2021-10-03: 50 ug via INTRAVENOUS
  Filled 2021-10-03: qty 1

## 2021-10-03 MED ORDER — ENOXAPARIN SODIUM 40 MG/0.4ML IJ SOSY
40.0000 mg | PREFILLED_SYRINGE | INTRAMUSCULAR | Status: DC
  Filled 2021-10-03 (×2): qty 0.4

## 2021-10-03 MED ORDER — IOHEXOL 350 MG/ML SOLN
75.0000 mL | Freq: Once | INTRAVENOUS | Status: AC | PRN
  Administered 2021-10-03: 75 mL via INTRAVENOUS

## 2021-10-03 MED ORDER — ACETAMINOPHEN 650 MG RE SUPP
650.0000 mg | Freq: Four times a day (QID) | RECTAL | Status: DC | PRN
Start: 2021-10-03 — End: 2021-10-07

## 2021-10-03 MED ORDER — INFLUENZA VAC SPLIT QUAD 0.5 ML IM SUSY: 0.5000 mL | PREFILLED_SYRINGE | INTRAMUSCULAR | Status: DC

## 2021-10-03 MED ORDER — ACETAMINOPHEN 325 MG PO TABS
650.0000 mg | ORAL_TABLET | Freq: Four times a day (QID) | ORAL | Status: DC | PRN
  Administered 2021-10-05 – 2021-10-06 (×3): 650 mg via ORAL
  Filled 2021-10-03 (×3): qty 2

## 2021-10-03 MED ORDER — PNEUMOCOCCAL 20-VAL CONJ VACC 0.5 ML IM SUSY
0.5000 mL | PREFILLED_SYRINGE | INTRAMUSCULAR | Status: DC
  Filled 2021-10-03: qty 0.5

## 2021-10-03 MED ORDER — ONDANSETRON HCL 4 MG/2ML IJ SOLN
4.0000 mg | Freq: Four times a day (QID) | INTRAMUSCULAR | Status: DC | PRN
  Administered 2021-10-03 – 2021-10-04 (×2): 4 mg via INTRAVENOUS
  Filled 2021-10-03: qty 2

## 2021-10-03 MED ORDER — ONDANSETRON HCL 4 MG/2ML IJ SOLN
4.0000 mg | Freq: Once | INTRAMUSCULAR | Status: AC
Start: 2021-10-03 — End: 2021-10-03
  Administered 2021-10-03: 4 mg via INTRAVENOUS
  Filled 2021-10-03: qty 2
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
## 2021-10-03 NOTE — H&P (Signed)
Date: 10/03/2021               Patient Name:  Nathaniel Hickman MRN: [id number]  DOB: 1966/01/14 Age / Sex: 56 y.o., male   PCP: Pcp, No         Medical Service: Internal Medicine Teaching Service         Attending Physician: Dr. Inez Catalina, MD    First Contact: Dr. Adron Bene, MD  Pager: [phone]  Second Contact: Dr. Quincy Simmonds, MD  Pager: [phone]       After Hours (After 5p/  First Contact Pager: [phone]  weekends / holidays): Second Contact Pager: [phone]   Chief Complaint: acute worsening of chronic abdominal pain   History of Present Illness:   Nathaniel Hickman is a 56 yo M with a PMH of AUD, pancreatitis who presents for persistent abdominal pain.   Patient reports that first began approximately 4 to 5 months ago.  He does not recall any specific provoking factors, but states that around this time he started having diffuse abdominal pain. The pain was sharp and would sometimes travel to his back. Frequently, the pain would self remit. However, over the past week or so the pain has become more intense and does not remit. He has had some associated emesis about 2-3 times per week of non-bloody non-bilious vomitus. He has been able to drink water but has otherwise had a poor appetite. He has also had chills, fatigue, and dizziness off and on throughout the past several months. He denies any HA, changes in vision, chest pain, subjective fevers, dyspnea, hematemesis, hematochezia, or melena.   Of note, he has had a previous diagnosis of pancreatitis in the past and it was thought to be due to alcohol use.    PMH:  Pancreatitis  Does not follow with a doctor regularly   Medications:  None   PSH:  None   FH:   SH:  No family in [place]. His siblings and mother are in [place]. He has been unhomed for the past 6 months.  He does not smoke. He drinks a 12 pack of beer over the course of a month. He does not use any other drugs.   Allergies: NKDA  Full  code  Meds:  Current Meds  Medication Sig   acetaminophen (TYLENOL) 500 MG tablet Take 2 tablets (1,000 mg total) by mouth every 6 (six) hours.    Past Medical History:  Diagnosis Date   Chronic back pain    Past Surgical History:  Procedure Laterality Date   ORIF ANKLE FRACTURE Left 05/15/2021   Procedure: OPEN REDUCTION INTERNAL FIXATION (ORIF) MALLEOLUS  FRACTURE;  Surgeon: Myrene Galas, MD;  Location: [facility] OR;  Service: Orthopedics;  Laterality: Left;   ORIF ULNAR FRACTURE Right 05/15/2021   Procedure: RIGHT OPEN REDUCTION INTERNAL FIXATION (ORIF) ULNAR FRACTURE;  Surgeon: Myrene Galas, MD;  Location: [facility] OR;  Service: Orthopedics;  Laterality: Right;     Family History: Family History  Problem Relation Age of Onset   Heart disease Neg Hx     Social History:   No family in [place]. His siblings and mother are in [place]. He has been unhomed for the past 6 months.  He does not smoke. He drinks a 12 pack of beer over the course of a month. He does not use any other drugs.   NKDA   Review of Systems: A complete ROS was negative except as per HPI.  Physical Exam: Blood pressure 118/86, pulse (!) 56, temperature 97.6 F (36.4 C), temperature source Oral, resp. rate 13, SpO2 99 %.  Constitutional: Thin, mal-nourished, and uncomfortable appearing  HENT:  Head: Normocephalic and atraumatic.  Eyes: EOM are normal. Yellowing of sclera  Neck: Normal range of motion.  Cardiovascular: Normal rate, regular rhythm, intact distal pulses. No gallop and no friction rub.  No murmur heard. No lower extremity edema  Pulmonary: Non labored breathing on room air, no wheezing or rales  Abdominal: Soft. Normal bowel sounds. Non distended. Diffusely tender to mild palpation, voluntary guarding  Musculoskeletal: Normal range of motion.        General: No tenderness or edema.  Neurological: Alert and oriented to person, place, and time. Non focal  Skin: Skin is warm and dry.    EKG:  personally reviewed my interpretation is NSR  CXR: personally reviewed my interpretation is no acute cardiopulmonary process.   CT A/P:  IMPRESSION: 1. Free fluid and fat stranding seen about the pancreatic head, compatible with pancreatitis. Scattered areas of gland hypoenhancement involving the pancreatic head are seen, concerning for necrotizing pancreatitis. No organized fluid collections. 2. Mild wall thickening of the proximal duodenum, likely reactive. Mild irregularity of the medial second portion of the duodenal wall is also seen which is likely cystic change related to chronic pancreatitis. 3. Cystic lesion of the pancreatic tail is slightly increased in size when compared with the prior exam, favor enlarging pseudocyst, although intraductal papillary mucinous neoplasm an additional consideration. Recommend further evaluation with MRCP after resolution of acute symptoms. 4.  Aortic Atherosclerosis (ICD10-I70.0).  Assessment & Plan by Problem: Principal Problem:   Acute pancreatitis Nathaniel Hickman is a 56 yo M with a PMH of AUD, pancreatitis, and housing instability who presents for acute worsening of chronic abdominal pain, found to have elevated lipase and CT A/P findings suggestive of acute pancreatitis possibly necrotizing.   #Acute pancreatitis (Necrotizing) Patient has a history of pancreatitis most recently 09/2020 thought that was thought to be due alcohol use. His current episode appears to be secondary to alcohol use as well though he notes he consumes a 12 pack over one month's time. He could be underestimating this or perhaps he now requires less alcohol to participate a acute pancreatic episode. Currently, he is HDS has no leukocytosis and is afebrile. Though given his imaging findings he is at increased risk of decompensation. If patient fevers or becomes hypotensive would start zosyn.  -For now will continue IV fluids and IV HM as needed for pain control   -ADAT to clears   #AUD #Alcoholic steatohepatitis  Patient has a chronic history of alcohol use though he reports he has cut back and his last drink was about 3 d ago. He has no history of alcohol withdrawals. He has had previous abdominal imaging which has showed hepatic steatosis likely secondary to his alcohol use. His synthetic function is WNL.  -Consulted TOC for resources regarding alcohol cessation -Will consider acamprosate or naltrexone on discharge to help with this   #Housing instability  Currently unhomed. TOC consult placed for resources regarding this.   Dispo: Admit patient to Inpatient with expected length of stay greater than 2 midnights.  Signed: Marolyn Haller, MD 10/03/2021, 2:36 PM  After 5pm on weekdays and 1pm on weekends: On Call pager: [phone]

## 2021-10-03 NOTE — ED Notes (Signed)
Pt c/o CP and ABD pain for days with decreased appetite. Abd soft disfuse tenderness. Grimacing with movement in bed.

## 2021-10-03 NOTE — ED Provider Triage Note (Signed)
Emergency Medicine Provider Triage Evaluation Note  Nathaniel Hickman , a 56 y.o. male  was evaluated in triage.  Pt complains of multiple complaints, chest pain back pain abdominal pain been going on for months, all these pains are intermittent, he states that he called EMS because he is having chest pain, its in the middle of his chest does not radiate, remains constant, no severe shortness of breath, become diaphoretic, denies alcohol use, tobacco use, no illicit drug use, he has no medical history.  No other complaints  EMS gave him 0.4 nitro came hypotensive and received 500 mL of fluids, came nauseous and he was given Zofran..  Review of Systems  Positive: Stomach pain chest pain Negative: Shortness of breath,, diaphoretic  Physical Exam  BP 112/85   Pulse 66   Temp 97.6 F (36.4 C) (Oral)   Resp 18   SpO2 100%  Gen:   Awake, no distress   Resp:  Normal effort  MSK:   Moves extremities without difficulty  Other:    Medical Decision Making  Medically screening exam initiated at 2:47 AM.  Appropriate orders placed.  Shuaib Corsino was informed that the remainder of the evaluation will be completed by another provider, this initial triage assessment does not replace that evaluation, and the importance of remaining in the ED until their evaluation is complete.  Lab work imaging been ordered will need further work-up.   Carroll Sage, PA-C 10/03/21 [phone]

## 2021-10-03 NOTE — ED Provider Notes (Signed)
[id number] [facility] EMERGENCY DEPARTMENT Provider Note   CSN: [id number] Arrival date & time: 10/03/21  6301     History  Chief Complaint  Patient presents with   Chest Pain    Nathaniel Hickman is a 56 y.o. male.  HPI 56 yo male complaining of abdominal pain radiating to back.  Patient states he has had before.  Pain began today.  Pain in upper abdomen.  Patient with nausea and vomiting, no diarrhea. Worsens with po intake. Last etoh couple of days ago and quit due to pain.      Home Medications Prior to Admission medications   Medication Sig Start Date End Date Taking? Authorizing Provider  acetaminophen (TYLENOL) 500 MG tablet Take 2 tablets (1,000 mg total) by mouth every 6 (six) hours. 05/18/21   Montez Morita, PA-C  ascorbic acid (VITAMIN C) 1000 MG tablet Take 1 tablet (1,000 mg total) by mouth daily. 05/18/21   Montez Morita, PA-C  Cholecalciferol 125 MCG (5000 UT) TABS Take 1 tablet by mouth daily. 05/18/21   Montez Morita, PA-C  docusate sodium (COLACE) 100 MG capsule Take 1 capsule (100 mg total) by mouth 2 (two) times daily. 05/18/21   Montez Morita, PA-C  HYDROcodone-acetaminophen (NORCO) 10-325 MG tablet Take 1 tablet by mouth every 6 (six) hours as needed for moderate pain or severe pain. 05/18/21   Montez Morita, PA-C  methocarbamol (ROBAXIN) 500 MG tablet Take 1-2 tablets (500-1,000 mg total) by mouth every 6 (six) hours as needed for muscle spasms. 05/18/21   Montez Morita, PA-C  Rivaroxaban (XARELTO) 15 MG TABS tablet Take 1 tablet (15 mg total) by mouth daily with supper. 05/18/21 06/17/21  Montez Morita, PA-C  Zinc Sulfate 220 (50 Zn) MG TABS Take 1 tablet (220 mg total) by mouth daily. 05/18/21   Montez Morita, PA-C      Allergies    Patient has no known allergies.    Review of Systems   Review of Systems  Physical Exam Updated Vital Signs BP 119/86   Pulse 60   Temp 97.7 F (36.5 C) (Oral)   Resp 17   SpO2 99%  Physical Exam  ED Results / Procedures /  Treatments   Labs (all labs ordered are listed, but only abnormal results are displayed) Labs Reviewed  COMPREHENSIVE METABOLIC PANEL - Abnormal; Notable for the following components:      Result Value   Potassium 3.2 (*)    CO2 20 (*)    Glucose, Bld 110 (*)    BUN 5 (*)    AST 238 (*)    ALT 105 (*)    Anion gap 16 (*)    All other components within normal limits  CBC WITH DIFFERENTIAL/PLATELET - Abnormal; Notable for the following components:   WBC 3.9 (*)    RBC 3.41 (*)    Hemoglobin 12.2 (*)    HCT 35.1 (*)    MCV 102.9 (*)    MCH 35.8 (*)    Platelets 117 (*)    All other components within normal limits  LIPASE, BLOOD - Abnormal; Notable for the following components:   Lipase 203 (*)    All other components within normal limits  TROPONIN I (HIGH SENSITIVITY)  TROPONIN I (HIGH SENSITIVITY)    EKG EKG Interpretation  Date/Time:  Wednesday October 03 2021 02:51:58 EDT Ventricular Rate:  72 PR Interval:  172 QRS Duration: 86 QT Interval:  394 QTC Calculation: 431 R Axis:   11 Text Interpretation: Normal  sinus rhythm with sinus arrhythmia Normal ECG When compared with ECG of 08-Oct-2020 12:59, PREVIOUS ECG IS PRESENT Confirmed by Margarita Grizzle [id number]) on 10/03/2021 10:01:20 AM  Radiology CT ABDOMEN PELVIS W CONTRAST  Result Date: 10/03/2021 CLINICAL DATA:  Abdominal pain EXAM: CT ABDOMEN AND PELVIS WITH CONTRAST TECHNIQUE: Multidetector CT imaging of the abdomen and pelvis was performed using the standard protocol following bolus administration of intravenous contrast. RADIATION DOSE REDUCTION: This exam was performed according to the departmental dose-optimization program which includes automated exposure control, adjustment of the mA and/or kV according to patient size and/or use of iterative reconstruction technique. CONTRAST:  45mL OMNIPAQUE IOHEXOL 350 MG/ML SOLN COMPARISON:  CT abdomen and pelvis dated October 08, 2020 FINDINGS: Lower chest: No acute abnormality.  Hepatobiliary: Hepatic steatosis. No suspicious focal liver lesion. Cholelithiasis with no gallbladder wall thickening. Pancreas: Fat stranding and free fluid seen about the pancreatic head. of gland hypoenhancement involving the pancreatic head, largest area is located on series 3, image 29. No organized fluid collections. Focal area of hypoattenuation involving the pancreatic tail measuring 9 mm on series 3, image 24, previously measured 6 mm on prior exam. Spleen: Normal in size without focal abnormality. Adrenals/Urinary Tract: Adrenal glands are unremarkable. No hydronephrosis or nephrolithiasis. No suspicious renal lesions. Bladder is unremarkable. Stomach/Bowel: Stomach is within normal limits. Appendix appears normal. Mild wall thickening of the proximal duodenum, likely reactive. Mild irregularity of the medial second portion of the duodenal wall seen on series 3 image 33. No evidence of obstruction. Vascular/Lymphatic: Aortic atherosclerosis. No enlarged abdominal or pelvic lymph nodes. Reproductive: Mild prostatomegaly. Other: No abdominal wall hernia. Musculoskeletal: No acute or significant osseous findings. IMPRESSION: 1. Free fluid and fat stranding seen about the pancreatic head, compatible with pancreatitis. Scattered areas of gland hypoenhancement involving the pancreatic head are seen, concerning for necrotizing pancreatitis. No organized fluid collections. 2. Mild wall thickening of the proximal duodenum, likely reactive. Mild irregularity of the medial second portion of the duodenal wall is also seen which is likely cystic change related to chronic pancreatitis. 3. Cystic lesion of the pancreatic tail is slightly increased in size when compared with the prior exam, favor enlarging pseudocyst, although intraductal papillary mucinous neoplasm an additional consideration. Recommend further evaluation with MRCP after resolution of acute symptoms. 4.  Aortic Atherosclerosis (ICD10-I70.0).  Electronically Signed   By: Allegra Lai M.D.   On: 10/03/2021 09:40   DG Chest 2 View  Result Date: 10/03/2021 CLINICAL DATA:  Chest pain EXAM: CHEST - 2 VIEW COMPARISON:  None Available. FINDINGS: Lungs are well expanded, symmetric, and clear. No pneumothorax or pleural effusion. Cardiac size within normal limits. Pulmonary vascularity is normal. Osseous structures are age-appropriate. No acute bone abnormality. IMPRESSION: No active cardiopulmonary disease. Electronically Signed   By: Helyn Numbers M.D.   On: 10/03/2021 03:04    Procedures Procedures    Medications Ordered in ED Medications  lactated ringers bolus 1,000 mL (0 mLs Intravenous Stopped 10/03/21 0959)  fentaNYL (SUBLIMAZE) injection 50 mcg (50 mcg Intravenous Given 10/03/21 0814)  ondansetron (ZOFRAN) injection 4 mg (4 mg Intravenous Given 10/03/21 0814)  iohexol (OMNIPAQUE) 350 MG/ML injection 75 mL (75 mLs Intravenous Contrast Given 10/03/21 0855)    ED Course/ Medical Decision Making/ A&P Clinical Course as of 10/03/21 1005  Wed Oct 03, 2021  0812 Lipase, blood(!) Lipase reviewed [DR]  0813 Comprehensive metabolic panel(!) #Hypokalemia potassium AST 238 ALT 105 These are elevated from first prior [DR]  0814 Chest x-[facility] [place] reviewed interpreted  no evidence of acute abnormality and radiologist interpretation concurs [DR]  0941 CT ABDOMEN PELVIS W CONTRAST [DR]  [phone] CBC reviewed and interpreted significant leukopenia Stable hemoglobin  [DR]  0953 Thrombocytosis noted with platelets decreased to 117 from first prior of 190.  [DR]    Clinical Course User Index [DR] Margarita Grizzle, MD                           Medical Decision Making 56 yo male ho pancreatitis and choledocholithiasis presents today with upper abdominal pain radiating to back with nausea and vomiting.  Patient with ho etoh abuse and had been drinking until 3 days ago when had to quit due to pain and vomiting. Evaluation here c.w. pancreatitis CT obtained  with pancreatitis and fluid noted- concern noted for necrotizing pancreatitis Mild wall thickening of duodenum likely reactive Patient treated here with iv fluids, antiemetics and pain medicine Cholelithiasis but no  gb wall thickening noted on ct Plan admission for ongoing monitoring, iv fluids, pain management and hydration. Patient without s/s acute etoh w/d Abdominal pain- likely secondary to acute pancreatitis secondary to alcohol abuse Cholelithiasis- no evidence of acute infection Elevated liver enzymes- likely secondary to etoh abuse although consider cbd Thrombocytopenia- worsened from first prior.  Again likely secondary to etoh abuse.  No s/s abnormal bleeding at this time. ETOH abuse- patient has been drinking but quit 3 days ago.  No s/s of acute etoh w/d    Amount and/or Complexity of Data Reviewed Labs: ordered. Decision-making details documented in ED Course. Radiology: ordered and independent interpretation performed. Decision-making details documented in ED Course. Discussion of management or test interpretation with external provider(s): Discussed with Dr. Montez Morita on for IM teaching service who will see for admission  Risk Prescription drug management. Parenteral controlled substances. Decision regarding hospitalization.           Final Clinical Impression(s) / ED Diagnoses Final diagnoses:  Alcohol-induced acute pancreatitis with uninfected necrosis  Abdominal pain, chronic, epigastric  Nausea and vomiting, unspecified vomiting type  Hypokalemia  Thrombocytopenia (HCC)    Rx / DC Orders ED Discharge Orders     None         Margarita Grizzle, MD 10/03/21 1005

## 2021-10-03 NOTE — Hospital Course (Addendum)
Patient presented with worsening abdominal pin, found to have elevated lipase and CT A/P findings consistent with acute pancreatitis. Enlarging pancreatic pseudocyst was also noted on exam.  Triglycerides and RUQ were checked to investigate etiology. TG were noted to be wnl and RUQ did reveal gallstones, but no impaction. Patient was placed on bowel rest, given IVFs, and pain was controlled with multimodal regimen. He clinically improved and diet was advanced. Pseudocyst was followed up with MRCP.  Alcohol use disorder/steatohepatitis Patient reported history of chronic alcohol use. He denied prior history of withdrawals. He was placed on CIWA protocol, but did not require Ativan. TOC was consulted for alcohol cessation and he was given resources.

## 2021-10-04 ENCOUNTER — Inpatient Hospital Stay (HOSPITAL_COMMUNITY): Payer: Self-pay

## 2021-10-04 DIAGNOSIS — F1090 Alcohol use, unspecified, uncomplicated: Secondary | ICD-10-CM

## 2021-10-04 DIAGNOSIS — K859 Acute pancreatitis without necrosis or infection, unspecified: Secondary | ICD-10-CM

## 2021-10-04 DIAGNOSIS — K863 Pseudocyst of pancreas: Secondary | ICD-10-CM

## 2021-10-04 DIAGNOSIS — Z5902 Unsheltered homelessness: Secondary | ICD-10-CM

## 2021-10-04 LAB — CBC
HCT: 33.3 % — ABNORMAL LOW (ref 39.0–52.0)
Hemoglobin: 11.9 g/dL — ABNORMAL LOW (ref 13.0–17.0)
MCH: 36.4 pg — ABNORMAL HIGH (ref 26.0–34.0)
MCHC: 35.7 g/dL (ref 30.0–36.0)
MCV: 101.8 fL — ABNORMAL HIGH (ref 80.0–100.0)
Platelets: 84 10*3/uL — ABNORMAL LOW (ref 150–400)
RBC: 3.27 MIL/uL — ABNORMAL LOW (ref 4.22–5.81)
RDW: 15 % (ref 11.5–15.5)
WBC: 4.1 10*3/uL (ref 4.0–10.5)
nRBC: 0 % (ref 0.0–0.2)

## 2021-10-04 LAB — COMPREHENSIVE METABOLIC PANEL
ALT: 76 U/L — ABNORMAL HIGH (ref 0–44)
AST: 118 U/L — ABNORMAL HIGH (ref 15–41)
Albumin: 3 g/dL — ABNORMAL LOW (ref 3.5–5.0)
Alkaline Phosphatase: 96 U/L (ref 38–126)
Anion gap: 10 (ref 5–15)
BUN: 6 mg/dL (ref 6–20)
CO2: 23 mmol/L (ref 22–32)
Calcium: 8.9 mg/dL (ref 8.9–10.3)
Chloride: 100 mmol/L (ref 98–111)
Creatinine, Ser: 0.77 mg/dL (ref 0.61–1.24)
GFR, Estimated: >60 mL/min (ref >60)
Glucose, Bld: 75 mg/dL (ref 70–99)
Potassium: 4.1 mmol/L (ref 3.5–5.1)
Sodium: 133 mmol/L — ABNORMAL LOW (ref 135–145)
Total Bilirubin: 1.2 mg/dL (ref 0.3–1.2)
Total Protein: 6.3 g/dL — ABNORMAL LOW (ref 6.5–8.1)

## 2021-10-04 LAB — LIPID PANEL
Cholesterol: 156 mg/dL (ref 0–200)
HDL: 80 mg/dL (ref >40)
LDL Cholesterol: 62 mg/dL (ref 0–99)
Total CHOL/HDL Ratio: 2 RATIO
Triglycerides: 72 mg/dL (ref <150)
VLDL: 14 mg/dL (ref 0–40)

## 2021-10-04 MED ORDER — THIAMINE MONONITRATE 100 MG PO TABS
100.0000 mg | ORAL_TABLET | Freq: Every day | ORAL | Status: DC
  Administered 2021-10-04 – 2021-10-07 (×4): 100 mg via ORAL
  Filled 2021-10-04 (×4): qty 1

## 2021-10-04 MED ORDER — LACTATED RINGERS IV SOLN: INTRAVENOUS | Status: DC

## 2021-10-04 MED ORDER — OXYCODONE HCL 5 MG PO TABS: 10.0000 mg | ORAL_TABLET | Freq: Four times a day (QID) | ORAL | Status: DC | PRN

## 2021-10-04 MED ORDER — ADULT MULTIVITAMIN W/MINERALS CH
1.0000 | ORAL_TABLET | Freq: Every day | ORAL | Status: DC
  Administered 2021-10-04 – 2021-10-07 (×4): 1 via ORAL
  Filled 2021-10-04 (×4): qty 1

## 2021-10-04 MED ORDER — FOLIC ACID 1 MG PO TABS
1.0000 mg | ORAL_TABLET | Freq: Every day | ORAL | Status: DC
  Administered 2021-10-04 – 2021-10-07 (×4): 1 mg via ORAL
  Filled 2021-10-04 (×4): qty 1

## 2021-10-04 MED ORDER — KETOROLAC TROMETHAMINE 30 MG/ML IJ SOLN
30.0000 mg | Freq: Three times a day (TID) | INTRAMUSCULAR | Status: DC | PRN
Start: 2021-10-04 — End: 2021-10-06
  Filled 2021-10-04: qty 1

## 2021-10-04 MED ORDER — THIAMINE HCL 100 MG/ML IJ SOLN
100.0000 mg | Freq: Every day | INTRAMUSCULAR | Status: DC
  Filled 2021-10-04 (×2): qty 1

## 2021-10-04 MED ORDER — BOOST / RESOURCE BREEZE PO LIQD CUSTOM
1.0000 | Freq: Three times a day (TID) | ORAL | Status: DC
  Administered 2021-10-06: 1 via ORAL

## 2021-10-04 NOTE — Progress Notes (Addendum)
HD#1 SUBJECTIVE:  Patient Summary: Mr. Endi Lagman is a 56 yo M with a PMH of AUD, pancreatitis, and housing instability who presents for acute worsening of chronic abdominal pain, found to have elevated lipase and CT A/P findings suggestive of acute pancreatitis possibly necrotizing.   Overnight Events:  naeo    Interm History:  Still having pain. Has not eaten. Not tolerating oral fluids well. Rates pain as 6.   OBJECTIVE:  Vital Signs: Vitals:   10/03/21 2327 10/04/21 0435 10/04/21 0541 10/04/21 0812  BP: 119/87 118/83 123/78 123/83  Pulse: 66 70 (!) 56 63  Resp: _0 Temp: 98 F (36.7 C) 98.3 F (36.8 C) 98.5 F (36.9 C) 98.8 F (37.1 C)  TempSrc: Oral Oral Oral Oral  SpO2: 99% 99% 100% 100%  Weight:      Height:       Supplemental O2:  SpO2: 100 %  Filed Weights   10/03/21 1821  Weight: 44.6 kg     Intake/Output Summary (Last 24 hours) at 10/04/2021 1559 Last data filed at 10/04/2021 1557 Gross per 24 hour  Intake 2973.59 ml  Output --  Net 2973.59 ml   Net IO Since Admission: 2,973.59 mL [10/04/21 1559]  Physical Exam: Constitutional: Thin, mal-nourished, and uncomfortable appearing  Eyes: EOM are normal. Yellowing of sclera  Cardiovascular: Normal rate, regular rhythm. No gallop and no friction rub. No murmur heard. Pulmonary: Non labored breathing on room air, no wheezing or rales  Abdominal: Soft. Normal bowel sounds. Non distended. TTP  Neurological: Alert No focal findings  Skin: Skin is warm and dry.   Patient Lines/Drains/Airways Status     Active Line/Drains/Airways     Name Placement date Placement time Site Days   Peripheral IV 10/03/21 20 G Left Antecubital 10/03/21  --  Antecubital  1   Incision (Closed) 05/15/21 Arm Right 05/15/21  2018  -- 142   Incision (Closed) 05/15/21 Leg Left 05/15/21  2018  -- 142            Pertinent Labs:    Latest Ref Rng & Units 10/04/2021    4:52 AM 10/03/2021    2:56 AM 06/05/2021    11:32 AM  CBC  WBC 4.0 - 10.5 K/uL 4.1  3.9  4.0   Hemoglobin 13.0 - 17.0 g/dL 11.9  12.2  11.1   Hematocrit 39.0 - 52.0 % 33.3  35.1  31.7   Platelets 150 - 400 K/uL 84  117  195        Latest Ref Rng & Units 10/04/2021    4:52 AM 10/03/2021    2:56 AM 06/05/2021   10:40 AM  CMP  Glucose 70 - 99 mg/dL 75  110  94   BUN 6 - 20 mg/dL _1 Creatinine 0.61 - 1.24 mg/dL 0.77  0.94  0.61   Sodium 135 - 145 mmol/L 133  135  136   Potassium 3.5 - 5.1 mmol/L 4.1  3.2  3.4   Chloride 98 - 111 mmol/L 100  99  104   CO2 22 - 32 mmol/L _2 Calcium 8.9 - 10.3 mg/dL 8.9  9.1  8.8   Total Protein 6.5 - 8.1 g/dL 6.3  7.4  7.4   Total Bilirubin 0.3 - 1.2 mg/dL 1.2  1.2  0.7   Alkaline Phos 38 - 126 U/L 96  108  103   AST  15 - 41 U/L 118  238  36   ALT 0 - 44 U/L 76  105  27     No results for input(s): "GLUCAP" in the last 72 hours.   Pertinent Imaging: US Abdomen Limited RUQ (LIVER/GB)  Result Date: 10/04/2021 CLINICAL DATA:  Acute pancreatitis. EXAM: ULTRASOUND ABDOMEN LIMITED RIGHT UPPER QUADRANT COMPARISON:  CT scan 10/03/2021 FINDINGS: Gallbladder: Shadowing echogenic gallstone noted the gallbladder. This measures approximately 8 mm. No gallbladder wall thickening or pericholecystic fluid to suggest acute cholecystitis. No sonographic Murphy sign. Common bile duct: Diameter: 2.0 mm Liver: Diffuse increased echogenicity consistent with fatty infiltration as seen on the CT scan. No focal hepatic lesions or intrahepatic biliary dilatation. Portal vein is patent on color Doppler imaging with normal direction of blood flow towards the liver. Other: None. IMPRESSION: 1. Cholelithiasis without sonographic findings for acute cholecystitis. 2. No biliary dilatation. 3. Diffuse fatty infiltration of the liver. Electronically Signed   By: Marijo Sanes M.D.   On: 10/04/2021 11:29    ASSESSMENT/PLAN:  Assessment: Principal Problem:   Acute pancreatitis  Mr. Jad Johansson is a 56 yo M with a  PMH of AUD, pancreatitis, and housing instability who presents for acute worsening of chronic abdominal pain, found to have elevated lipase and CT A/P findings suggestive of acute pancreatitis possibly necrotizing.    #Acute necrotizing pancreatitis  # pancreatitic pseudocyst Patient has a history of pancreatitis most recently 09/2020 thought that was thought to be due alcohol use.  Patient reporting drinking 24 beers over 1 month span, much less than would be expected with alcoholic pancreatitis. CT scan showed cholelithiasis. RUQ also showed gall stones without acute cholecystitis or ductal dilation. Possible that he had gallstone pancreatitis with passed stone, though alk phos not significantly elevated. Alcoholic pancreatitis remains on differential. TG wnl and ruled out as etiology.  Necrotic portion and pseudocyst place patient at high risk. If he becomes hypotensive or febrile will need to start Zosyn. - Continue IVFs - Continue IV dilaudid, PO oxy, toradol, and tylenol for pain control - ADAT to clears  - Consider MRCP to further evaluate cystic lesion once acute symptoms have resolved   #AUD #Alcoholic steatohepatitis  Patient has a chronic history of alcohol use, last reported drink 3 days PTA. He has no history of alcohol withdrawals.  Previous abdominal imaging which has showed hepatic steatosis likely secondary to his alcohol use. His synthetic function is WNL.  -Consulted TOC for resources regarding alcohol cessation -Will consider acamprosate or naltrexone on discharge to help with this  - CIWA protocol - Vitamin supplementation  # hypokalemia - daily BMP - replete as necessary   #Housing instability  Currently unhomed. TOC consult placed for resources regarding this.    Dispo: Admit patient to Inpatient with expected length of stay greater than 2 midnights.  Signature: Delene Ruffini, MD

## 2021-10-04 NOTE — TOC CAGE-AID Note (Signed)
Transition of Care [facility]) - CAGE-AID Screening   Patient Details  Name: Delvecchio Madole MRN: [id number] Date of Birth: [birth year]  Transition of Care [facility]) CM/SW Contact:    Janae Bridgeman, RN Phone Number: 10/04/2021, 2:08 PM   Clinical Narrative: Patient given resource relating to follow up for Outpatient Substance abuse counseling.   CAGE-AID Screening:    Have You Ever Felt You Ought to Cut Down on Your Drinking or Drug Use?: No Have People Annoyed You By Critizing Your Drinking Or Drug Use?: No Have You Felt Bad Or Guilty About Your Drinking Or Drug Use?: No Have You Ever Had a Drink or Used Drugs First Thing In The Morning to Steady Your Nerves or to Get Rid of a Hangover?: No CAGE-AID Score: 0  Substance Abuse Education Offered: Yes  Substance abuse interventions: Patient Counseling, Transport planner

## 2021-10-04 NOTE — TOC Initial Note (Signed)
Transition of Care [facility]) - Initial/Assessment Note    Patient Details  Name: Nathaniel Hickman MRN: [id number] Date of Birth: July 27, 1965  Transition of Care [facility]) CM/SW Contact:    Curlene Labrum, RN Phone Number: 10/04/2021, 2:01 PM  Clinical Narrative:                 CM met with the patient at the bedside to discuss transitions of care needs.  The patient lives in a tent and has been homeless in the area for the past 3 years.  He states that he prefers to stay in a tent since the shelters are dirty and fights between the residents make him uncomfortable.  Shelter resources were provided but patient prefers to return to tent.  The patient was given contact and address to [facility] training center for job opportunities along with address and contact information to [place] in [place] to assist with Medicaid application and other community resources.  The patient currently receives food stamps and has a cell phone - number placed in Epic.  The patient has no transportation - bus passes will be given to the patient prior to discharge to assist with transportation.  Transportation resource sheet was also given to the patient.  CAGE assessment was completed.  The patient has used beer in the past but he has recently been unable to tolerate due to severe abdominal pain.  Substance abuse counseling resources provided to the patient.  Discharge medications need to be provided through [facility] since the patient has no insurance provider nor funding to pay for medications.  CM will continue to follow the patient for discharge needs and plans to return to tent/IRC when medically stable for discharge.  Expected Discharge Plan: Homeless Shelter Barriers to Discharge: Continued Medical Work up   Patient Goals and CMS Choice Patient states their goals for this hospitalization and ongoing recovery are:: to get better and return to tent. CMS Medicare.gov Compare Post Acute Care list provided to::  Patient Choice offered to / list presented to : Patient  Expected Discharge Plan and Services Expected Discharge Plan: [place]   Discharge Planning Services: CM Consult, Medication Assistance, Follow-up appt scheduled, [facility]   Living arrangements for the past 2 months: Homeless (Lives in a tent - declined Shelter resources)                                      Prior Living Arrangements/Services Living arrangements for the past 2 months: Homeless (Lives in a tent - declined Shelter resources) Lives with:: Self Patient language and need for interpreter reviewed:: Yes Do you feel safe going back to the place where you live?: Yes      Need for Family Participation in Patient Care: Yes (Comment) Care giver support system in place?: No (comment)   Criminal Activity/Legal Involvement Pertinent to Current Situation/Hospitalization: No - Comment as needed  Activities of Daily Living Home Assistive Devices/Equipment: None ADL Screening (condition at time of admission) Patient's cognitive ability adequate to safely complete daily activities?: Yes Is the patient deaf or have difficulty hearing?: No Does the patient have difficulty seeing, even when wearing glasses/contacts?: No Does the patient have difficulty concentrating, remembering, or making decisions?: No Patient able to express need for assistance with ADLs?: Yes Does the patient have difficulty dressing or bathing?: Yes Independently performs ADLs?: Yes (appropriate for developmental age) Does the patient have difficulty walking  or climbing stairs?: No Weakness of Legs: None Weakness of Arms/Hands: None  Permission Sought/Granted Permission sought to share information with : Case Manager, PCP Permission granted to share information with : Yes, Verbal Permission Granted     Permission granted to share info w AGENCY: TOC pharmacy, [facility]        Emotional Assessment Appearance::  Appears stated age Attitude/Demeanor/Rapport: Gracious Affect (typically observed): Accepting Orientation: : Oriented to Self, Oriented to Place, Oriented to  Time, Oriented to Situation Alcohol / Substance Use: Alcohol Use Psych Involvement: No (comment)  Admission diagnosis:  Acute pancreatitis [K85.90] Hypokalemia [E87.6] Thrombocytopenia (HCC) [D69.6] Abdominal pain, chronic, epigastric [R10.13, G89.29] Alcohol-induced acute pancreatitis with uninfected necrosis [K85.21] Nausea and vomiting, unspecified vomiting type [R11.2] Patient Active Problem List   Diagnosis Date Noted   Hyponatremia 05/16/2021   Ankle fracture, left 05/15/2021   Ankle fracture 05/14/2021   Closed nondisplaced fracture of medial malleolus of left tibia, initial encounter 05/14/2021   Alcohol use 05/14/2021   Ulna fracture 05/14/2021   Protein-calorie malnutrition, severe 10/11/2020   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 [place] [address] Phone: [phone] Fax: [phone]     Social Determinants of Health (SDOH) Interventions Food Insecurity Interventions: Inpatient TOC Housing Interventions: Inpatient TOC (provided with Shelter Resources) Transportation Interventions: Bus Pass Given  Readmission Risk Interventions    10/04/2021    2:01 PM  Readmission Risk Prevention Plan  Post Dischage Appt Complete  Medication Screening Complete  Transportation Screening Complete

## 2021-10-05 ENCOUNTER — Inpatient Hospital Stay (HOSPITAL_COMMUNITY): Payer: Self-pay

## 2021-10-05 LAB — COMPREHENSIVE METABOLIC PANEL
ALT: 56 U/L — ABNORMAL HIGH (ref 0–44)
AST: 65 U/L — ABNORMAL HIGH (ref 15–41)
Albumin: 3 g/dL — ABNORMAL LOW (ref 3.5–5.0)
Alkaline Phosphatase: 78 U/L (ref 38–126)
Anion gap: 12 (ref 5–15)
BUN: <5 mg/dL — ABNORMAL LOW (ref 6–20)
CO2: 24 mmol/L (ref 22–32)
Calcium: 9.3 mg/dL (ref 8.9–10.3)
Chloride: 98 mmol/L (ref 98–111)
Creatinine, Ser: 0.68 mg/dL (ref 0.61–1.24)
GFR, Estimated: >60 mL/min (ref >60)
Glucose, Bld: 105 mg/dL — ABNORMAL HIGH (ref 70–99)
Potassium: 3.1 mmol/L — ABNORMAL LOW (ref 3.5–5.1)
Sodium: 134 mmol/L — ABNORMAL LOW (ref 135–145)
Total Bilirubin: 1.2 mg/dL (ref 0.3–1.2)
Total Protein: 6.1 g/dL — ABNORMAL LOW (ref 6.5–8.1)

## 2021-10-05 LAB — CBC
HCT: 30.5 % — ABNORMAL LOW (ref 39.0–52.0)
Hemoglobin: 11 g/dL — ABNORMAL LOW (ref 13.0–17.0)
MCH: 36.3 pg — ABNORMAL HIGH (ref 26.0–34.0)
MCHC: 36.1 g/dL — ABNORMAL HIGH (ref 30.0–36.0)
MCV: 100.7 fL — ABNORMAL HIGH (ref 80.0–100.0)
Platelets: 85 10*3/uL — ABNORMAL LOW (ref 150–400)
RBC: 3.03 MIL/uL — ABNORMAL LOW (ref 4.22–5.81)
RDW: 14.3 % (ref 11.5–15.5)
WBC: 3.3 10*3/uL — ABNORMAL LOW (ref 4.0–10.5)
nRBC: 0 % (ref 0.0–0.2)

## 2021-10-05 MED ORDER — GADOBUTROL 1 MMOL/ML IV SOLN
4.5000 mL | Freq: Once | INTRAVENOUS | Status: AC | PRN
  Administered 2021-10-05: 4.5 mL via INTRAVENOUS

## 2021-10-05 MED ORDER — POTASSIUM CHLORIDE 20 MEQ PO PACK
80.0000 meq | PACK | Freq: Once | ORAL | Status: AC
Start: 2021-10-05 — End: 2021-10-05
  Administered 2021-10-05: 80 meq via ORAL
  Filled 2021-10-05: qty 4

## 2021-10-05 NOTE — Progress Notes (Signed)
HD#2 SUBJECTIVE:  Patient Summary: Nathaniel Hickman is a 56 yo M with a PMH of AUD, pancreatitis, and housing instability who presents for acute worsening of chronic abdominal pain, found to have elevated lipase and CT A/P findings suggestive of acute pancreatitis possibly necrotizing.   Overnight Events:  naeo    Interm History:  Sitting up in bed. Asking for diet. Complains of liquid only diet. Pain is well controlled without pain meds.   OBJECTIVE:  Vital Signs: Vitals:   10/04/21 1610 10/04/21 2116 10/05/21 0554 10/05/21 0732  BP: (!) 126/96 (!) 130/93 (!) 128/92 118/89  Pulse: 74 76 93 (!) 56  Resp: _0 Temp: 98.8 F (37.1 C) 99.4 F (37.4 C) 98.8 F (37.1 C) 98 F (36.7 C)  TempSrc: Oral Oral    SpO2: 100% 99% 100% 100%  Weight:      Height:       Supplemental O2:  SpO2: 100 %  Filed Weights   10/03/21 1821  Weight: 44.6 kg     Intake/Output Summary (Last 24 hours) at 10/05/2021 1349 Last data filed at 10/05/2021 1222 Gross per 24 hour  Intake 1124.79 ml  Output --  Net 1124.79 ml   Net IO Since Admission: 3,858.38 mL [10/05/21 1349]  Physical Exam:  Constitutional: Thin, mal-nourished, NAD Cardiovascular: Normal rate, regular rhythm. No gallop and no friction rub. No murmur heard. Pulmonary: Non labored breathing on room air, no wheezing or rales  Abdominal: Non distended. Non-peritonitic Neurological: Alert, No focal findings  Skin: warm and dry.   Patient Lines/Drains/Airways Status     Active Line/Drains/Airways     Name Placement date Placement time Site Days   Peripheral IV 10/03/21 20 G Left Antecubital 10/03/21  --  Antecubital  1   Incision (Closed) 05/15/21 Arm Right 05/15/21  2018  -- 142   Incision (Closed) 05/15/21 Leg Left 05/15/21  2018  -- 142            Pertinent Labs:    Latest Ref Rng & Units 10/05/2021    7:17 AM 10/04/2021    4:52 AM 10/03/2021    2:56 AM  CBC  WBC 4.0 - 10.5 K/uL 3.3  4.1  3.9   Hemoglobin  13.0 - 17.0 g/dL 11.0  11.9  12.2   Hematocrit 39.0 - 52.0 % 30.5  33.3  35.1   Platelets 150 - 400 K/uL 85  84  117        Latest Ref Rng & Units 10/05/2021    7:17 AM 10/04/2021    4:52 AM 10/03/2021    2:56 AM  CMP  Glucose 70 - 99 mg/dL 105  75  110   BUN 6 - 20 mg/dL <_1 Creatinine 0.61 - 1.24 mg/dL 0.68  0.77  0.94   Sodium 135 - 145 mmol/L 134  133  135   Potassium 3.5 - 5.1 mmol/L 3.1  4.1  3.2   Chloride 98 - 111 mmol/L 98  100  99   CO2 22 - 32 mmol/L _2 Calcium 8.9 - 10.3 mg/dL 9.3  8.9  9.1   Total Protein 6.5 - 8.1 g/dL 6.1  6.3  7.4   Total Bilirubin 0.3 - 1.2 mg/dL 1.2  1.2  1.2   Alkaline Phos 38 - 126 U/L 78  96  108   AST 15 - 41 U/L 65  118  238   ALT 0 - 44 U/L 56  76  105     No results for input(s): "GLUCAP" in the last 72 hours.   Pertinent Imaging: No results found.  ASSESSMENT/PLAN:  Assessment: Principal Problem:   Acute pancreatitis  Nathaniel Hickman is a 56 yo M with a PMH of AUD, pancreatitis, and housing instability who presents for acute worsening of chronic abdominal pain, found to have elevated lipase and CT A/P findings suggestive of acute pancreatitis possibly necrotizing.    #Acute necrotizing pancreatitis  # pancreatitic pseudocyst Patient has a history of pancreatitis most recently 09/2020 thought that was thought to be due alcohol use.  Patient reporting drinking 24 beers over 1 month span, much less than would be expected with alcoholic pancreatitis. CT scan showed cholelithiasis. RUQ also showed gallstones without acute cholecystitis or ductal dilation. Possible that he had gallstone pancreatitis with passed stone, though alk phos not significantly elevated. Alcoholic pancreatitis remains on differential. TG wnl and ruled out as etiology.  Necrotic portion and pseudocyst place patient at high risk. Patient has been HDS, afebrile, and required no pain meds yesterday, did have 1 PRN dose zofran.  - solid food today -  tylenol as needed for pain - MRCP today. Pending MRCP results, can discharge later today with GI follow up as necessary   #AUD #Alcoholic steatohepatitis  Patient has a chronic history of alcohol use, last reported drink 3 days PTA. He has no history of alcohol withdrawals.  Previous abdominal imaging which has showed hepatic steatosis likely secondary to his alcohol use. His synthetic function is WNL.  -Consulted TOC for resources regarding alcohol cessation -Will consider acamprosate or naltrexone on discharge to help with this  - CIWA protocol - Vitamin supplementation  # hypokalemia - daily BMP - replete as necessary   #Housing instability  Currently unhomed. TOC consult placed for resources regarding this. Patient given resources for shelter, but states he prefers his tent due to conflicts with other shelter residents.    Dispo: Admit patient to Inpatient with expected length of stay greater than 2 midnights.  Signature: Delene Ruffini, MD

## 2021-10-05 NOTE — Discharge Instructions (Signed)
Dear Nathaniel Hickman,  Thank you for trusting [place] with your care. We treated you in the hospital for pancreatitis. It may be related to either previous alcohol use or gallstones. We also noticed that your liver has some chronic damage likely related to past alcohol use.  You can continue to take Tylenol 650 mg 4 times a day as needed for pain.  You will need to follow-up with internal medicine center in 1 to 2 weeks.  You will need to repeat imaging of the pancreas in 6 to 8 weeks.  Please call the internal medicine center at [phone] if you do not hear back about an appointment or if you have questions.  Please return to the emergency room if you have worsening pain, fevers, uncontrolled nausea or vomiting, or you are unable to tolerate food.

## 2021-10-05 NOTE — Discharge Summary (Incomplete)
Name: Nathaniel Hickman MRN: [id number] DOB: May 29, 1965 56 y.o. PCP: Pcp, No  Date of Admission: 10/03/2021  2:33 AM Date of Discharge: 10/07/21  Attending Physician: Dr. Criselda Peaches  Discharge Diagnosis: Principal Problem:   Acute pancreatitis Active Problems:   Pseudocyst of pancreas   Hypokalemia    Discharge Medications: Allergies as of 10/07/2021   No Known Allergies      Medication List     STOP taking these medications    HYDROcodone-acetaminophen 10-325 MG tablet Commonly known as: NORCO   Xarelto 15 MG Tabs tablet Generic drug: Rivaroxaban       TAKE these medications    Acetaminophen Extra Strength 500 MG Tabs Take 2 tablets (1,000 mg total) by mouth every 6 (six) hours.   docusate sodium 100 MG capsule Commonly known as: COLACE Take 1 capsule (100 mg total) by mouth 2 (two) times daily.   methocarbamol 500 MG tablet Commonly known as: ROBAXIN Take 1-2 tablets (500-1,000 mg total) by mouth every 6 (six) hours as needed for muscle spasms.   naltrexone 50 MG tablet Commonly known as: DEPADE Take 1 tablet (50 mg total) by mouth daily.   SM Vitamin D3 125 MCG (5000 UT) Tabs Generic drug: Cholecalciferol Take 1 tablet by mouth daily.   vitamin C 1000 MG tablet Take 1 tablet (1,000 mg total) by mouth daily.   Zinc Sulfate 220 (50 Zn) MG Tabs Take 1 tablet (220 mg total) by mouth daily.        Disposition and follow-up:   [name] was discharged from [facility] in Stable condition.  At the hospital follow up visit please address:  1.  Follow-up:  a. Acute pancreatitis: assess that patient able to tolerate diet and pain is well controlled, repeat CMP for resolution of elevated liver enzymes    b. Hypokalemia: recheck potassium   c. Pancreatic pseudocyst: Likely pseudocyst, needs repeat CT for evolving cystic lesions that may represent neoplastic process    D. Hepatic steatosis: likely due to history of heavy alcohol  use, will need evaluation for other causes  2.  Labs / imaging needed at time of follow-up: CMP,CT abdomen in 6  weeks to evaluate cystic pancreatic lesions  3.  Pending labs/ test needing follow-up: none  Follow-up Appointments:  Follow-up Information     [facility]. Go in 1 week(s).   Why: Please follow up at the internal medicine center. Please call to make an appointment if you do not hear back in a few days. Contact information: [address] [place] [address] [phone]                Hospital Course by problem list:  Mr. [name] is a 56 yo M with a PMH of AUD, pancreatitis who presents for persistent abdominal pain admitted for acute necrotizing pancreatitis  Acute necrotizing pancreatitis Pancreatic pseudocyst Patient with history of pancreatitis thought to be secondary to alcohol use. Last admission in September 2022. He reports he is drinking less with about 24 beers over 1 month. CT with free fluid and fat stranding of the pancreatitic head, cystic lesion of the pancreatitic tail enlarged from prior studies. Cholelithiasis noted. RUQ [place] without biliary dilation or acute cholecystitis. MRCP with 9 mm cystic lesion of the pancreatic body and 6 mm cystic lesion of the pancreatic head. Pancreatic body lesion seems smaller than on prior CT from 09/2020, head lesion appears new. Likely represent pseudocyst although neoplasm cannot  be excluded. Also with soft tissue prominence of hte pancreatic head may be due to acute pancreatitis, will need short term follow up with repeat CT to monitor for resolution. He was treated with IVF and pain medication. Pain improved, liver enzymes also improving. On day of discharge patient tolerating diet without nausea, vomiting, or significant abdominal pain. Will set up outpatient follow up with [facility]  Hypokalemia K noted to be low during admission to 3.0. likely in setting of vomiting and poor  PO intake due to pancreatitis. This was replaced. Patient declined labs on day of discharge. Will need potassium rechecked at follow up.  AUD Hepatic steatosis History of heavy alcohol use although this seems to have improved lately. CT with hepatic steatosis likely secondary to alcohol use. Elevated liver enzymes on admission in setting of acute pancreatitis. This was improving during admission. No signs of withdrawal during admission. Will need outpatient follow up to discuss hepatic steatosis and alcohol use.  Discharge Subjective:   Discharge Exam:   BP 114/89 (BP Location: Left Arm)   Pulse 81   Temp 98.5 F (36.9 C) (Oral)   Resp 16   Ht 5\' 5"  (1.651 m)   Wt 44.6 kg   SpO2 100%   BMI 16.36 kg/m  Constitutional: Laying in bed appears comfortable, thin appearing Cardiovascular: Normal rate, regular rhythm.  Pulmonary: Non labored breathing on room air, no wheezing or rales  Abdominal: normal BS, non distended, minimal TTP  Neurological: Alert, No focal findings  Skin: warm and dry.   Pertinent Labs, Studies, and Procedures:     Latest Ref Rng & Units 10/06/2021    4:35 PM 10/05/2021    7:17 AM 10/04/2021    4:52 AM  CBC  WBC 4.0 - 10.5 K/uL 4.0  3.3  4.1   Hemoglobin 13.0 - 17.0 g/dL 12/04/2021  21.3  08.6   Hematocrit 39.0 - 52.0 % 33.4  30.5  33.3   Platelets 150 - 400 K/uL 116  85  84        Latest Ref Rng & Units 10/06/2021    4:35 PM 10/05/2021    7:17 AM 10/04/2021    4:52 AM  CMP  Glucose 70 - 99 mg/dL 12/04/2021  469  75   BUN 6 - 20 mg/dL 6  <5  6   Creatinine 629 - 1.24 mg/dL 5.28  4.13  2.44   Sodium 135 - 145 mmol/L 129  134  133   Potassium 3.5 - 5.1 mmol/L 3.0  3.1  4.1   Chloride 98 - 111 mmol/L 95  98  100   CO2 22 - 32 mmol/L 23  24  23    Calcium 8.9 - 10.3 mg/dL 9.0  9.3  8.9   Total Protein 6.5 - 8.1 g/dL 6.9  6.1  6.3   Total Bilirubin 0.3 - 1.2 mg/dL 1.0  1.2  1.2   Alkaline Phos 38 - 126 U/L 89  78  96   AST 15 - 41 U/L 59  65  118   ALT 0 - 44 U/L 53  56   76     0.10 Abdomen Limited RUQ (LIVER/GB)  Result Date: 10/04/2021 CLINICAL DATA:  Acute pancreatitis. EXAM: ULTRASOUND ABDOMEN LIMITED RIGHT UPPER QUADRANT COMPARISON:  CT scan 10/03/2021 FINDINGS: Gallbladder: Shadowing echogenic gallstone noted the gallbladder. This measures approximately 8 mm. No gallbladder wall thickening or pericholecystic fluid to suggest acute cholecystitis. No sonographic Murphy sign. Common bile duct: Diameter: 2.0 mm  Liver: Diffuse increased echogenicity consistent with fatty infiltration as seen on the CT scan. No focal hepatic lesions or intrahepatic biliary dilatation. Portal vein is patent on color Doppler imaging with normal direction of blood flow towards the liver. Other: None. IMPRESSION: 1. Cholelithiasis without sonographic findings for acute cholecystitis. 2. No biliary dilatation. 3. Diffuse fatty infiltration of the liver. Electronically Signed   By: Rudie Meyer M.D.   On: 10/04/2021 11:29   CT ABDOMEN PELVIS W CONTRAST  Result Date: 10/03/2021 CLINICAL DATA:  Abdominal pain EXAM: CT ABDOMEN AND PELVIS WITH CONTRAST TECHNIQUE: Multidetector CT imaging of the abdomen and pelvis was performed using the standard protocol following bolus administration of intravenous contrast. RADIATION DOSE REDUCTION: This exam was performed according to the departmental dose-optimization program which includes automated exposure control, adjustment of the mA and/or kV according to patient size and/or use of iterative reconstruction technique. CONTRAST:  92mL OMNIPAQUE IOHEXOL 350 MG/ML SOLN COMPARISON:  CT abdomen and pelvis dated October 08, 2020 FINDINGS: Lower chest: No acute abnormality. Hepatobiliary: Hepatic steatosis. No suspicious focal liver lesion. Cholelithiasis with no gallbladder wall thickening. Pancreas: Fat stranding and free fluid seen about the pancreatic head. of gland hypoenhancement involving the pancreatic head, largest area is located on series 3, image 29. No  organized fluid collections. Focal area of hypoattenuation involving the pancreatic tail measuring 9 mm on series 3, image 24, previously measured 6 mm on prior exam. Spleen: Normal in size without focal abnormality. Adrenals/Urinary Tract: Adrenal glands are unremarkable. No hydronephrosis or nephrolithiasis. No suspicious renal lesions. Bladder is unremarkable. Stomach/Bowel: Stomach is within normal limits. Appendix appears normal. Mild wall thickening of the proximal duodenum, likely reactive. Mild irregularity of the medial second portion of the duodenal wall seen on series 3 image 33. No evidence of obstruction. Vascular/Lymphatic: Aortic atherosclerosis. No enlarged abdominal or pelvic lymph nodes. Reproductive: Mild prostatomegaly. Other: No abdominal wall hernia. Musculoskeletal: No acute or significant osseous findings. IMPRESSION: 1. Free fluid and fat stranding seen about the pancreatic head, compatible with pancreatitis. Scattered areas of gland hypoenhancement involving the pancreatic head are seen, concerning for necrotizing pancreatitis. No organized fluid collections. 2. Mild wall thickening of the proximal duodenum, likely reactive. Mild irregularity of the medial second portion of the duodenal wall is also seen which is likely cystic change related to chronic pancreatitis. 3. Cystic lesion of the pancreatic tail is slightly increased in size when compared with the prior exam, favor enlarging pseudocyst, although intraductal papillary mucinous neoplasm an additional consideration. Recommend further evaluation with MRCP after resolution of acute symptoms. 4.  Aortic Atherosclerosis (ICD10-I70.0). Electronically Signed   By: Allegra Lai M.D.   On: 10/03/2021 09:40   DG Chest 2 View  Result Date: 10/03/2021 CLINICAL DATA:  Chest pain EXAM: CHEST - 2 VIEW COMPARISON:  None Available. FINDINGS: Lungs are well expanded, symmetric, and clear. No pneumothorax or pleural effusion. Cardiac size  within normal limits. Pulmonary vascularity is normal. Osseous structures are age-appropriate. No acute bone abnormality. IMPRESSION: No active cardiopulmonary disease. Electronically Signed   By: Helyn Numbers M.D.   On: 10/03/2021 03:04       Discharge Instructions: Discharge Instructions     Call MD for:  persistant nausea and vomiting   Complete by: As directed    Call MD for:  severe uncontrolled pain   Complete by: As directed    Call MD for:  temperature >100.4   Complete by: As directed    Diet - low sodium heart  healthy   Complete by: As directed    Increase activity slowly   Complete by: As directed        Signed: Quincy Simmonds, MD 10/07/2021, 12:57 PM   Pager: [phone]

## 2021-10-05 NOTE — Progress Notes (Signed)
Mobility Specialist Progress Note:   10/05/21 1044  Mobility  Activity Ambulated independently in hallway  Level of Assistance Modified independent, requires aide device or extra time  Assistive Device Other (Comment) (IV Pole)  Distance Ambulated (ft) 550 ft  Activity Response Tolerated well  $Mobility charge 1 Mobility   Pt received in bed and agreeable. No complaints. Pt left sitting EOB with all needs met and call bell in reach.   Nathaniel Hickman Mobility Specialist-Acute Rehab Secure Chat only

## 2021-10-05 NOTE — Progress Notes (Signed)
Blood pressure @1821  was 153/107 in the right arm and 146/116 in the left arm. Patient is asymptomatic. Notified Dr. . No new orders at this time. Continue to monitor.

## 2021-10-06 DIAGNOSIS — E876 Hypokalemia: Secondary | ICD-10-CM

## 2021-10-06 LAB — CBC
HCT: 33.4 % — ABNORMAL LOW (ref 39.0–52.0)
Hemoglobin: 12.1 g/dL — ABNORMAL LOW (ref 13.0–17.0)
MCH: 36.2 pg — ABNORMAL HIGH (ref 26.0–34.0)
MCHC: 36.2 g/dL — ABNORMAL HIGH (ref 30.0–36.0)
MCV: 100 fL (ref 80.0–100.0)
Platelets: 116 10*3/uL — ABNORMAL LOW (ref 150–400)
RBC: 3.34 MIL/uL — ABNORMAL LOW (ref 4.22–5.81)
RDW: 13.7 % (ref 11.5–15.5)
WBC: 4 10*3/uL (ref 4.0–10.5)
nRBC: 0 % (ref 0.0–0.2)

## 2021-10-06 LAB — COMPREHENSIVE METABOLIC PANEL
ALT: 53 U/L — ABNORMAL HIGH (ref 0–44)
AST: 59 U/L — ABNORMAL HIGH (ref 15–41)
Albumin: 3.5 g/dL (ref 3.5–5.0)
Alkaline Phosphatase: 89 U/L (ref 38–126)
Anion gap: 11 (ref 5–15)
BUN: 6 mg/dL (ref 6–20)
CO2: 23 mmol/L (ref 22–32)
Calcium: 9 mg/dL (ref 8.9–10.3)
Chloride: 95 mmol/L — ABNORMAL LOW (ref 98–111)
Creatinine, Ser: 0.79 mg/dL (ref 0.61–1.24)
GFR, Estimated: >60 mL/min (ref >60)
Glucose, Bld: 159 mg/dL — ABNORMAL HIGH (ref 70–99)
Potassium: 3 mmol/L — ABNORMAL LOW (ref 3.5–5.1)
Sodium: 129 mmol/L — ABNORMAL LOW (ref 135–145)
Total Bilirubin: 1 mg/dL (ref 0.3–1.2)
Total Protein: 6.9 g/dL (ref 6.5–8.1)

## 2021-10-06 MED ORDER — POTASSIUM CHLORIDE CRYS ER 20 MEQ PO TBCR
40.0000 meq | EXTENDED_RELEASE_TABLET | Freq: Two times a day (BID) | ORAL | Status: AC
  Administered 2021-10-06 – 2021-10-07 (×2): 40 meq via ORAL
  Filled 2021-10-06 (×2): qty 2

## 2021-10-06 MED ORDER — OXYCODONE HCL 5 MG PO TABS
5.0000 mg | ORAL_TABLET | Freq: Four times a day (QID) | ORAL | Status: DC | PRN
  Administered 2021-10-06 – 2021-10-07 (×2): 5 mg via ORAL
  Filled 2021-10-06 (×2): qty 1

## 2021-10-06 NOTE — Progress Notes (Addendum)
HD#3 SUBJECTIVE:  Patient Summary: [name] is a 56 yo M with a PMH of AUD, pancreatitis, and housing instability who presents for acute worsening of chronic abdominal pain, found to have elevated lipase and CT A/P findings suggestive of acute pancreatitis possibly necrotizing.   Overnight Events:  none    Interm History:  Patient evaluated at bedside holding abdomen. States abdominal pain radiates to back worse after eating dinner yesterday. Reports have a pork chop last night nad has had more nausea and 1 episode of non bloody emesis this morning. Discussed he is here for pancreatitis, discussed trying a lighter diet such as soup or broth which he is agreeable to trying.   OBJECTIVE:  Vital Signs: Vitals:   10/05/21 0732 10/05/21 1821 10/05/21 1824 10/05/21 1950  BP: 118/89 (!) 153/107 (!) 146/116 (!) 125/95  Pulse: (!) 56 63  61  Resp: 16 18  16   Temp: 98 F (36.7 C) 99.6 F (37.6 C)  98.4 F (36.9 C)  TempSrc:  Oral  Oral  SpO2: 100% 100%  99%  Weight:      Height:       Supplemental O2:  SpO2: 99 %  Filed Weights   10/03/21 1821  Weight: 44.6 kg     Intake/Output Summary (Last 24 hours) at 10/06/2021 0716 Last data filed at 10/05/2021 1222 Gross per 24 hour  Intake 177 ml  Output --  Net 177 ml    Net IO Since Admission: 3,858.38 mL [10/06/21 0716]  Physical Exam:  Constitutional: sitting up in bed, appears uncomfortable HENT: poor dentition Cardiovascular: normal rate and rhythm  Pulmonary: No increased work of breathing, CTA b/l Abdominal: hyperactive BS, TTP of the epigastrium and LUQ Neurological: Alert and oriented to person place, and situation Skin: warm and dry.   Patient Lines/Drains/Airways Status     Active Line/Drains/Airways     Name Placement date Placement time Site Days   Peripheral IV 10/03/21 20 G Left Antecubital 10/03/21  --  Antecubital  1   Incision (Closed) 05/15/21 Arm Right 05/15/21  2018  -- 142   Incision  (Closed) 05/15/21 Leg Left 05/15/21  2018  -- 142            Pertinent Labs:    Latest Ref Rng & Units 10/05/2021    7:17 AM 10/04/2021    4:52 AM 10/03/2021    2:56 AM  CBC  WBC 4.0 - 10.5 K/uL 3.3  4.1  3.9   Hemoglobin 13.0 - 17.0 g/dL 12/03/2021  54.2  70.6   Hematocrit 39.0 - 52.0 % 30.5  33.3  35.1   Platelets 150 - 400 K/uL 85  84  117        Latest Ref Rng & Units 10/05/2021    7:17 AM 10/04/2021    4:52 AM 10/03/2021    2:56 AM  CMP  Glucose 70 - 99 mg/dL 12/03/2021  75  628   BUN 6 - 20 mg/dL 5  6  5    Creatinine 0.61 - 1.24 mg/dL 315  1.76  1.60   Sodium 135 - 145 mmol/L 134  133  135   Potassium 3.5 - 5.1 mmol/L 3.1  4.1  3.2   Chloride 98 - 111 mmol/L 98  100  99   CO2 22 - 32 mmol/L 24  23  20    Calcium 8.9 - 10.3 mg/dL 9.3  8.9  9.1   Total Protein 6.5 - 8.1 g/dL 6.1  6.3  7.4   Total Bilirubin 0.3 - 1.2 mg/dL 1.2  1.2  1.2   Alkaline Phos 38 - 126 U/L 78  96  108   AST 15 - 41 U/L 65  118  238   ALT 0 - 44 U/L 56  76  105     No results for input(s): "GLUCAP" in the last 72 hours.   Pertinent Imaging: MR ABDOMEN MRCP W WO CONTAST  Result Date: 10/05/2021 CLINICAL DATA:  Necrotizing pancreatitis. EXAM: MRI ABDOMEN WITHOUT AND WITH CONTRAST (INCLUDING MRCP) TECHNIQUE: Multiplanar multisequence MR imaging of the abdomen was performed both before and after the administration of intravenous contrast. Heavily T2-weighted images of the biliary and pancreatic ducts were obtained, and three-dimensional MRCP images were rendered by post processing. CONTRAST:  4.32mL GADAVIST GADOBUTROL 1 MMOL/ML IV SOLN COMPARISON:  CT on 10/03/2021 FINDINGS: Lower chest: No acute findings. Hepatobiliary: Severe diffuse hepatic steatosis is demonstrated. A few tiny sub-cm cysts are noted in the left hepatic lobe, however no hepatic masses are identified. A few tiny gallstones are seen in the gallbladder neck, however there is no evidence of cholecystitis or biliary ductal dilatation. Pancreas: Mild  peripancreatic edema inflammatory changes appear decreased compared to recent CT. The main pancreatic duct is not significantly dilated but shows diffuse irregularity, likely due to underlying chronic pancreatitis. A 9 mm cystic lesion is again seen in the pancreatic body, and a 6 mm cystic lesion is seen in the pancreatic head, which likely represents small pseudocysts although cystic pancreatic neoplasms cannot be excluded. Focal soft tissue prominence is again seen in the pancreatic head measuring 2.5 x 1.8 cm. This could be due to focal acute pancreatitis although pancreatic mass or peripancreatic lymphadenopathy cannot be excluded. Spleen:  Within normal limits in size and appearance. Adrenals/Urinary Tract: No suspicious masses identified. No evidence of hydronephrosis. Stomach/Bowel: Unremarkable. Vascular/Lymphatic: No pathologically enlarged lymph nodes identified. No acute vascular findings. Other:  None. Musculoskeletal:  No suspicious bone lesions identified. IMPRESSION: Acute pancreatitis appears improved compared to recent CT. Underlying changes of chronic pancreatitis also noted. Stable 9 mm cystic lesion in the pancreatic body, and 6 mm cystic lesion in the pancreatic head. These most likely represent pseudocysts, although cystic pancreatic neoplasms cannot be excluded. Persistent soft tissue prominence in the pancreatic head, which could be due to focal acute pancreatitis, although pancreatic mass or peripancreatic lymphadenopathy cannot be excluded. Recommend continued short-term follow-up by CT. Severe diffuse hepatic steatosis. Cholelithiasis. No radiographic evidence of cholecystitis or biliary ductal dilatation. Electronically Signed   By: Danae Orleans M.D.   On: 10/05/2021 17:59    ASSESSMENT/PLAN:  Assessment: Principal Problem:   Acute pancreatitis  [name] is a 56 yo M with a PMH of AUD, pancreatitis, and housing instability who presents for acute worsening of chronic  abdominal pain, found to have elevated lipase and CT A/P findings suggestive of acute pancreatitis possibly necrotizing.    #Acute necrotizing pancreatitis  # pancreatitic pseudocyst Patient has a history of pancreatitis most recently 09/2020 thought that was thought to be due alcohol use. CT scan c/w acute pancreatitis with cholelithiasis. RUQ with cholelithiasis but without cholecystitis or ductal dilation. Possible that he had gallstone pancreatitis with passed stone. Restarted diet yesterday, now with more abdominal pain. MRCP yesterday with 74mm and 6 mm cystic structures of the pancreatic head and body.Appears smaller than reported size on prior CT in 2022. Most likely pseudocysts although pancreatic neoplasm cannot be excluded.   - Advance  diet as tolerated - tylenol and oxycodone PRN pain - Needs outpatient repeat CT to monitor pancreatic cysts  -follow up CMP   #AUD #Alcoholic steatohepatitis  No signs of withdrawal. Patient reports speaking with social work about this yesterday -Consulted TOC for resources regarding alcohol cessation -Will consider acamprosate or naltrexone on discharge to help with this  - CIWA protocol - Vitamin supplementation  # Hypokalemia Refused morning labs but now agreeable. Will follow up CMP.  - Check CMP   #Housing instability  Currently unhomed. TOC consult placed for resources regarding this. Patient given resources for shelter, but states he prefers his tent due to conflicts with other shelter residents.    Dispo: Admit patient to Inpatient with expected length of stay greater than 2 midnights.  Signature: Iona Beard, MD 10/06/21 3:41 PM  Pager [phone]

## 2021-10-07 DIAGNOSIS — E876 Hypokalemia: Secondary | ICD-10-CM

## 2021-10-07 MED ORDER — NALTREXONE HCL 50 MG PO TABS: 50.0000 mg | ORAL_TABLET | Freq: Every day | ORAL | 0 refills | Status: AC

## 2021-10-07 NOTE — Progress Notes (Signed)
Mobility Specialist Progress Note:   10/07/21 1102  Mobility  Activity Ambulated independently in room  Level of Assistance Independent  Assistive Device None  Distance Ambulated (ft) 25 ft  Activity Response Tolerated well  $Mobility charge 1 Mobility   Pt received in bed and agreeable. Pt ambulated in room but, declined further mobility d/t being discharged soon. Pt left ambulating in room with all needs met and call bell in reach.   Kaitlyn Wall Mobility Specialist-Acute Rehab Secure Chat only  

## 2021-10-22 ENCOUNTER — Ambulatory Visit (INDEPENDENT_AMBULATORY_CARE_PROVIDER_SITE_OTHER): Payer: Self-pay | Admitting: Student

## 2021-10-22 DIAGNOSIS — K863 Pseudocyst of pancreas: Secondary | ICD-10-CM

## 2021-10-23 NOTE — Progress Notes (Signed)
Patient no showed for appointment on 10/22/2021

## 2022-01-13 ENCOUNTER — Other Ambulatory Visit: Payer: Self-pay

## 2022-01-13 ENCOUNTER — Emergency Department (HOSPITAL_COMMUNITY): Payer: Self-pay

## 2022-01-13 ENCOUNTER — Emergency Department (HOSPITAL_COMMUNITY)
Admission: EM | Admit: 2022-01-13 | Discharge: 2022-01-13 | Disposition: A | Discharge status: Home / Self Care | Payer: Self-pay | Attending: Emergency Medicine | Admitting: Emergency Medicine

## 2022-01-13 DIAGNOSIS — X58XXXA Exposure to other specified factors, initial encounter: Secondary | ICD-10-CM | POA: Insufficient documentation

## 2022-01-13 DIAGNOSIS — S90512A Abrasion, left ankle, initial encounter: Secondary | ICD-10-CM | POA: Insufficient documentation

## 2022-01-13 DIAGNOSIS — R200 Anesthesia of skin: Secondary | ICD-10-CM | POA: Insufficient documentation

## 2022-01-13 MED ORDER — ACETAMINOPHEN 500 MG PO TABS
1000.0000 mg | ORAL_TABLET | ORAL | Status: AC
  Administered 2022-01-13: 1000 mg via ORAL
  Filled 2022-01-13: qty 2

## 2022-01-13 MED ORDER — GABAPENTIN 100 MG PO CAPS: 100.0000 mg | ORAL_CAPSULE | Freq: Three times a day (TID) | ORAL | 0 refills | Status: AC

## 2022-01-13 NOTE — Discharge Instructions (Signed)
You were seen today for numbness, tingling and pain to your foot that began after your ankle surgery.  It is likely this is related to your injury and healing nerves.  I do not see signs of blood clots or infection or indication for emergent surgery or hospitalization.  It is also possible this is coming from your back, but I don't see an emergent indication for surgery.    We will start you on gabapentin to help with your pain-the dose can be adjusted as an outpatient.  I recommend follow up with PCP and consider neurology referral.

## 2022-01-13 NOTE — Care Management (Signed)
PCP added to AVS 

## 2022-01-13 NOTE — ED Triage Notes (Signed)
Patient reports chronic left foot numbness for several months .
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
## 2022-05-17 ENCOUNTER — Other Ambulatory Visit (HOSPITAL_COMMUNITY): Payer: Self-pay

## 2022-05-17 ENCOUNTER — Emergency Department (HOSPITAL_COMMUNITY): Payer: Self-pay

## 2022-05-17 ENCOUNTER — Other Ambulatory Visit: Payer: Self-pay

## 2022-05-17 ENCOUNTER — Encounter (HOSPITAL_COMMUNITY): Payer: Self-pay | Admitting: Internal Medicine

## 2022-05-17 ENCOUNTER — Emergency Department (HOSPITAL_COMMUNITY)
Admission: EM | Admit: 2022-05-17 | Discharge: 2022-05-17 | Disposition: A | Discharge status: Home / Self Care | Payer: Self-pay | Attending: Emergency Medicine | Admitting: Emergency Medicine

## 2022-05-17 DIAGNOSIS — Y906 Blood alcohol level of 120-199 mg/100 ml: Secondary | ICD-10-CM | POA: Insufficient documentation

## 2022-05-17 DIAGNOSIS — R109 Unspecified abdominal pain: Secondary | ICD-10-CM | POA: Diagnosis present

## 2022-05-17 DIAGNOSIS — R1084 Generalized abdominal pain: Secondary | ICD-10-CM

## 2022-05-17 DIAGNOSIS — R531 Weakness: Secondary | ICD-10-CM | POA: Insufficient documentation

## 2022-05-17 DIAGNOSIS — E871 Hypo-osmolality and hyponatremia: Secondary | ICD-10-CM | POA: Insufficient documentation

## 2022-05-17 DIAGNOSIS — F1010 Alcohol abuse, uncomplicated: Secondary | ICD-10-CM | POA: Diagnosis present

## 2022-05-17 DIAGNOSIS — R42 Dizziness and giddiness: Secondary | ICD-10-CM | POA: Insufficient documentation

## 2022-05-17 DIAGNOSIS — F1020 Alcohol dependence, uncomplicated: Secondary | ICD-10-CM

## 2022-05-17 DIAGNOSIS — R5383 Other fatigue: Secondary | ICD-10-CM | POA: Insufficient documentation

## 2022-05-17 DIAGNOSIS — Z5900 Homelessness unspecified: Secondary | ICD-10-CM

## 2022-05-17 DIAGNOSIS — K860 Alcohol-induced chronic pancreatitis: Secondary | ICD-10-CM | POA: Insufficient documentation

## 2022-05-17 HISTORY — DX: Alcohol dependence, uncomplicated: F10.20

## 2022-05-17 HISTORY — DX: Homelessness unspecified: Z59.00

## 2022-05-17 LAB — URINALYSIS, ROUTINE W REFLEX MICROSCOPIC
Bilirubin Urine: NEGATIVE
Glucose, UA: NEGATIVE mg/dL
Hgb urine dipstick: NEGATIVE
Ketones, ur: NEGATIVE mg/dL
Nitrite: NEGATIVE
Protein, ur: NEGATIVE mg/dL
Specific Gravity, Urine: 1.009 (ref 1.005–1.030)
pH: 5 (ref 5.0–8.0)

## 2022-05-17 LAB — COMPREHENSIVE METABOLIC PANEL
ALT: 65 U/L — ABNORMAL HIGH (ref 0–44)
AST: 131 U/L — ABNORMAL HIGH (ref 15–41)
Albumin: 3.3 g/dL — ABNORMAL LOW (ref 3.5–5.0)
Alkaline Phosphatase: 145 U/L — ABNORMAL HIGH (ref 38–126)
Anion gap: 14 (ref 5–15)
BUN: 6 mg/dL (ref 6–20)
CO2: 20 mmol/L — ABNORMAL LOW (ref 22–32)
Calcium: 8.9 mg/dL (ref 8.9–10.3)
Chloride: 91 mmol/L — ABNORMAL LOW (ref 98–111)
Creatinine, Ser: 0.95 mg/dL (ref 0.61–1.24)
GFR, Estimated: >60 mL/min (ref >60)
Glucose, Bld: 167 mg/dL — ABNORMAL HIGH (ref 70–99)
Potassium: 3.7 mmol/L (ref 3.5–5.1)
Sodium: 125 mmol/L — ABNORMAL LOW (ref 135–145)
Total Bilirubin: 1.6 mg/dL — ABNORMAL HIGH (ref 0.3–1.2)
Total Protein: 7.8 g/dL (ref 6.5–8.1)

## 2022-05-17 LAB — CBC WITH DIFFERENTIAL/PLATELET
Abs Immature Granulocytes: 0.02 10*3/uL (ref 0.00–0.07)
Basophils Absolute: 0 10*3/uL (ref 0.0–0.1)
Basophils Relative: 1 %
Eosinophils Absolute: 0 10*3/uL (ref 0.0–0.5)
Eosinophils Relative: 0 %
HCT: 33.7 % — ABNORMAL LOW (ref 39.0–52.0)
Hemoglobin: 12.1 g/dL — ABNORMAL LOW (ref 13.0–17.0)
Immature Granulocytes: 0 %
Lymphocytes Relative: 17 %
Lymphs Abs: 0.8 10*3/uL (ref 0.7–4.0)
MCH: 36.3 pg — ABNORMAL HIGH (ref 26.0–34.0)
MCHC: 35.9 g/dL (ref 30.0–36.0)
MCV: 101.2 fL — ABNORMAL HIGH (ref 80.0–100.0)
Monocytes Absolute: 0.4 10*3/uL (ref 0.1–1.0)
Monocytes Relative: 9 %
Neutro Abs: 3.3 10*3/uL (ref 1.7–7.7)
Neutrophils Relative %: 73 %
Platelets: 127 10*3/uL — ABNORMAL LOW (ref 150–400)
RBC: 3.33 MIL/uL — ABNORMAL LOW (ref 4.22–5.81)
RDW: 12.4 % (ref 11.5–15.5)
WBC: 4.5 10*3/uL (ref 4.0–10.5)
nRBC: 0 % (ref 0.0–0.2)

## 2022-05-17 LAB — ETHANOL: Alcohol, Ethyl (B): 116 mg/dL — ABNORMAL HIGH (ref <10)

## 2022-05-17 LAB — LIPASE, BLOOD: Lipase: 25 U/L (ref 11–51)

## 2022-05-17 LAB — RAPID URINE DRUG SCREEN, HOSP PERFORMED
Amphetamines: NOT DETECTED
Barbiturates: NOT DETECTED
Benzodiazepines: NOT DETECTED
Cocaine: NOT DETECTED
Opiates: NOT DETECTED
Tetrahydrocannabinol: POSITIVE — AB

## 2022-05-17 LAB — TROPONIN I (HIGH SENSITIVITY)
Troponin I (High Sensitivity): 8 ng/L (ref <18)
Troponin I (High Sensitivity): 7 ng/L (ref <18)

## 2022-05-17 MED ORDER — THIAMINE HCL 100 MG/ML IJ SOLN
100.0000 mg | Freq: Once | INTRAMUSCULAR | Status: AC
  Administered 2022-05-17: 100 mg via INTRAVENOUS
  Filled 2022-05-17: qty 2

## 2022-05-17 MED ORDER — ADULT MULTIVITAMIN W/MINERALS CH
1.0000 | ORAL_TABLET | Freq: Once | ORAL | Status: AC
  Administered 2022-05-17: 1 via ORAL
  Filled 2022-05-17: qty 1

## 2022-05-17 MED ORDER — PANTOPRAZOLE SODIUM 40 MG PO TBEC
40.0000 mg | DELAYED_RELEASE_TABLET | Freq: Every day | ORAL | 0 refills | Status: AC
  Filled 2022-05-17: qty 30, 30d supply, fill #0

## 2022-05-17 MED ORDER — FOLIC ACID 1 MG PO TABS
1.0000 mg | ORAL_TABLET | Freq: Once | ORAL | Status: AC
  Administered 2022-05-17: 1 mg via ORAL
  Filled 2022-05-17: qty 1

## 2022-05-17 MED ORDER — SUCRALFATE 1 G PO TABS
1.0000 g | ORAL_TABLET | Freq: Three times a day (TID) | ORAL | 0 refills | Status: AC
  Filled 2022-05-17: qty 60, 15d supply, fill #0

## 2022-05-17 MED ORDER — IOHEXOL 350 MG/ML SOLN
65.0000 mL | Freq: Once | INTRAVENOUS | Status: AC | PRN
  Administered 2022-05-17: 65 mL via INTRAVENOUS

## 2022-05-17 MED ORDER — SODIUM CHLORIDE 0.9 % IV BOLUS
1000.0000 mL | Freq: Once | INTRAVENOUS | Status: AC
  Administered 2022-05-17: 1000 mL via INTRAVENOUS
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
## 2022-05-17 NOTE — ED Provider Notes (Signed)
EMERGENCY DEPARTMENT AT [facility] Provider Note   CSN: [id number] Arrival date & time: 05/17/22  4098     History  Chief Complaint  Patient presents with   not eating   Chills    Nathaniel Hickman is a 57 y.o. male.  HPI   Patient presents to the ED with complaints of feeling fatigued, weak lightheaded.  Patient states he has not been eating much for the last couple of days.  Feels cold all the time.  He is homeless and admits to drinking alcohol.  Patient states he is only drinking beer.  No fevers.  No chest pain.  No shortness of breath.  Home Medications Prior to Admission medications   Medication Sig Start Date End Date Taking? Authorizing Provider  acetaminophen (TYLENOL) 500 MG tablet Take 2 tablets (1,000 mg total) by mouth every 6 (six) hours. 05/18/21   Montez Morita, PA-C  ascorbic acid (VITAMIN C) 1000 MG tablet Take 1 tablet (1,000 mg total) by mouth daily. Patient not taking: Reported on 10/03/2021 05/18/21   Montez Morita, PA-C  Cholecalciferol 125 MCG (5000 UT) TABS Take 1 tablet by mouth daily. Patient not taking: Reported on 10/03/2021 05/18/21   Montez Morita, PA-C  docusate sodium (COLACE) 100 MG capsule Take 1 capsule (100 mg total) by mouth 2 (two) times daily. Patient not taking: Reported on 10/03/2021 05/18/21   Montez Morita, PA-C  gabapentin (NEURONTIN) 100 MG capsule Take 1 capsule (100 mg total) by mouth 3 (three) times daily. 01/13/22 02/12/22  Alvira Monday, MD  methocarbamol (ROBAXIN) 500 MG tablet Take 1-2 tablets (500-1,000 mg total) by mouth every 6 (six) hours as needed for muscle spasms. Patient not taking: Reported on 10/03/2021 05/18/21   Montez Morita, PA-C  Zinc Sulfate 220 (50 Zn) MG TABS Take 1 tablet (220 mg total) by mouth daily. Patient not taking: Reported on 10/03/2021 05/18/21   Montez Morita, PA-C      Allergies    Patient has no known allergies.    Review of Systems   Review of Systems  Physical Exam Updated Vital Signs BP (!)  121/99 (BP Location: Right Arm)   Pulse (!) 101   Temp 97.6 F (36.4 C)   Resp 18   Ht 1.651 m (5\' 5" )   Wt 49.9 kg   SpO2 100%   BMI 18.30 kg/m  Physical Exam Vitals and nursing note reviewed.  Constitutional:      Appearance: He is well-developed. He is not diaphoretic.     Comments: Underweight  HENT:     Head: Normocephalic and atraumatic.     Right Ear: External ear normal.     Left Ear: External ear normal.  Eyes:     General: No scleral icterus.       Right eye: No discharge.        Left eye: No discharge.     Conjunctiva/sclera: Conjunctivae normal.  Neck:     Trachea: No tracheal deviation.  Cardiovascular:     Rate and Rhythm: Normal rate and regular rhythm.  Pulmonary:     Effort: Pulmonary effort is normal. No respiratory distress.     Breath sounds: Normal breath sounds. No stridor. No wheezing or rales.  Abdominal:     General: Bowel sounds are normal. There is no distension.     Palpations: Abdomen is soft.     Tenderness: There is no abdominal tenderness. There is no guarding or rebound.  Musculoskeletal:  General: No tenderness or deformity.     Cervical back: Neck supple.  Skin:    General: Skin is warm and dry.     Findings: No rash.  Neurological:     General: No focal deficit present.     Mental Status: He is alert.     Cranial Nerves: No cranial nerve deficit, dysarthria or facial asymmetry.     Sensory: No sensory deficit.     Motor: No abnormal muscle tone or seizure activity.     Coordination: Coordination normal.  Psychiatric:        Mood and Affect: Mood normal.     ED Results / Procedures / Treatments   Labs (all labs ordered are listed, but only abnormal results are displayed) Labs Reviewed  CBC WITH DIFFERENTIAL/PLATELET - Abnormal; Notable for the following components:      Result Value   RBC 3.33 (*)    Hemoglobin 12.1 (*)    HCT 33.7 (*)    MCV 101.2 (*)    MCH 36.3 (*)    Platelets 127 (*)    All other components  within normal limits  COMPREHENSIVE METABOLIC PANEL - Abnormal; Notable for the following components:   Sodium 125 (*)    Chloride 91 (*)    CO2 20 (*)    Glucose, Bld 167 (*)    Albumin 3.3 (*)    AST 131 (*)    ALT 65 (*)    Alkaline Phosphatase 145 (*)    Total Bilirubin 1.6 (*)    All other components within normal limits  URINALYSIS, ROUTINE W REFLEX MICROSCOPIC - Abnormal; Notable for the following components:   Leukocytes,Ua TRACE (*)    Bacteria, UA RARE (*)    All other components within normal limits  ETHANOL - Abnormal; Notable for the following components:   Alcohol, Ethyl (B) 116 (*)    All other components within normal limits  RAPID URINE DRUG SCREEN, HOSP PERFORMED - Abnormal; Notable for the following components:   Tetrahydrocannabinol POSITIVE (*)    All other components within normal limits  LIPASE, BLOOD  TROPONIN I (HIGH SENSITIVITY)  TROPONIN I (HIGH SENSITIVITY)    EKG EKG Interpretation  Date/Time:  Friday May 17 2022 08:42:05 EDT Ventricular Rate:  100 PR Interval:  136 QRS Duration: 82 QT Interval:  348 QTC Calculation: 448 R Axis:   55 Text Interpretation: Normal sinus rhythm Normal ECG When compared with ECG of 03-Oct-2021 02:51, PREVIOUS ECG IS PRESENT No significant change was found Confirmed by Glynn Octave [phone]) on 05/17/2022 8:58:22 AM  Radiology CT ABDOMEN PELVIS W CONTRAST  Result Date: 05/17/2022 CLINICAL DATA:  Abdominal pain, history of pancreatitis. EXAM: CT ABDOMEN AND PELVIS WITH CONTRAST TECHNIQUE: Multidetector CT imaging of the abdomen and pelvis was performed using the standard protocol following bolus administration of intravenous contrast. RADIATION DOSE REDUCTION: This exam was performed according to the departmental dose-optimization program which includes automated exposure control, adjustment of the mA and/or kV according to patient size and/or use of iterative reconstruction technique. CONTRAST:  65mL OMNIPAQUE  IOHEXOL 350 MG/ML SOLN COMPARISON:  October 03, 2021. FINDINGS: Lower chest: No acute abnormality. Hepatobiliary: Hepatic steatosis. Minimal cholelithiasis is noted. No biliary dilatation noted. Pancreas: Mild low density is seen involving the pancreatic head concern for pancreatitis. No definite pseudocyst formation is noted. No ductal dilatation is noted. Body and tail are unremarkable. Spleen: Normal in size without focal abnormality. Adrenals/Urinary Tract: Adrenal glands are unremarkable. Kidneys are normal, without renal  calculi, focal lesion, or hydronephrosis. Bladder is unremarkable. Stomach/Bowel: Stomach is unremarkable. There is no evidence of bowel obstruction or inflammation. The appendix is not clearly visualized. Vascular/Lymphatic: Aortic atherosclerosis. No enlarged abdominal or pelvic lymph nodes. Reproductive: Prostate is unremarkable. Other: No abdominal wall hernia or abnormality. No abdominopelvic ascites. Musculoskeletal: No acute or significant osseous findings. IMPRESSION: Low density is seen involving pancreatic head concerning for acute pancreatitis. Correlation with clinical and laboratory data is recommended. No ductal dilatation or pseudocyst formation is noted. Hepatic steatosis. Minimal cholelithiasis. Aortic Atherosclerosis (ICD10-I70.0). Electronically Signed   By: Lupita Raider M.D.   On: 05/17/2022 13:08    Procedures Procedures    Medications Ordered in ED Medications  sodium chloride 0.9 % bolus 1,000 mL (1,000 mLs Intravenous New Bag/Given 05/17/22 1252)  thiamine (VITAMIN B1) injection 100 mg (100 mg Intravenous Given 05/17/22 1252)  folic acid (FOLVITE) tablet 1 mg (1 mg Oral Given 05/17/22 1252)  multivitamin with minerals tablet 1 tablet (1 tablet Oral Given 05/17/22 1252)  iohexol (OMNIPAQUE) 350 MG/ML injection 65 mL (65 mLs Intravenous Contrast Given 05/17/22 1243)    ED Course/ Medical Decision Making/ A&P Clinical Course as of 05/17/22 1457  Fri May 17, 2022  1107 CBC with Differential(!) Labs show mild anemia but this is unchanged compared to previous values. [JK]  1107 Electrolyte panel does show decreased sodium of 125 but this is similar to previous values.  LFTs are elevated with an elevated bilirubin.  This is increased compared to previous values.  No evidence of urinary tract infection.  Patient's alcohol level is elevated at 116 [JK]  1321 CT abd pelvis, possible pancreatitis although lipase normal.  Possible chronic pancreatitis [JK]  1425 Case discussed with Dr. Ophelia Charter.  She will evaluate patient to see if admission is indicated [JK]    Clinical Course User Index [JK] Linwood Dibbles, MD                             Medical Decision Making Problems Addressed: Alcohol-induced chronic pancreatitis: chronic illness or injury with exacerbation, progression, or side effects of treatment that poses a threat to life or bodily functions Alcoholism: acute illness or injury that poses a threat to life or bodily functions Hyponatremia: chronic illness or injury with exacerbation, progression, or side effects of treatment that poses a threat to life or bodily functions  Amount and/or Complexity of Data Reviewed Labs: ordered. Decision-making details documented in ED Course. Radiology: ordered and independent interpretation performed.  Risk Prescription drug management.    presented to the ED for evaluation of poor appetite chills abdominal pain dry heaves.  Patient does admit to continued daily alcohol use.  Patient did not think that was an issue as he denies drinking hard alcohol.  Just beer.  Patient does have elevated alcohol level this morning.  He states he had not had anything since yesterday.  Patient with decreased p.o. intake anorexia.  Labs do not show elevated lipase but his CT scan does suggest inflammation around the pancreas consistent with chronic pancreatitis.  Patient also has evidence of hyponatremia and liver disease likely  related to his alcohol use.  Patient has poor social support as he is homeless.  Considering his laboratory abnormalities decreased p.o. intake I will consult the medical service for admission and further treatment.  Dr. Ophelia Charter evaluated the patient in the ED.  Does not feel the patient requires admission to the hospital.  Will start the patient on Protonix and Carafate.  I will consult TOC to see if we can get some medication assistance and arrange outpatient follow-up       Final Clinical Impression(s) / ED Diagnoses Final diagnoses:  Alcohol-induced chronic pancreatitis  Alcoholism  Hyponatremia    Rx / DC Orders ED Discharge Orders     None         Linwood Dibbles, MD 05/17/22 1458

## 2022-05-17 NOTE — Discharge Instructions (Addendum)
Avoid drinking beer or any other alcoholic beverage.  Start taking the antacid medications to help with your appetite.  [facility].

## 2022-05-17 NOTE — ED Triage Notes (Signed)
Pt. Stated, Nathaniel Hickman not eaten in a couple of days I drink beer from time to time. I get cold real easy/ Pt smells of slcohol

## 2022-05-17 NOTE — Progress Notes (Signed)
TOC CSW provided pt resources attached to AVS for substance use and shelter for possible future use.  Zakeya Junker Tarpley-Carter, MSW, LCSW-A Pronouns:  She/Her/Hers [facility] Clinical Social Worker Direct Number:  [phone] [email or web address]

## 2022-05-17 NOTE — ED Provider Triage Note (Signed)
Emergency Medicine Provider Triage Evaluation Note  Gael Londo , a 57 y.o. male  was evaluated in triage.  Pt complains of poor appetite, chills, abdominal pain, dry heaving x 2-3 days. Hx alcohol abuse and pancreatitis.  Review of Systems  Positive: Abdominal pain, chills, vomiting  Negative: Fever, chest pain  Physical Exam  BP (!) 121/99 (BP Location: Right Arm)   Pulse (!) 101   Temp 97.6 F (36.4 C)   Resp 18   Ht  (1.651 m)   Wt 49.9 kg   SpO2 100%   BMI 18.30 kg/m  Gen:   Awake, no distress   Resp:  Normal effort  MSK:   Moves extremities without difficulty  Other:  Diffuse abdominal tenderness, voluntary guarding  Medical Decision Making  Medically screening exam initiated at 8:41 AM.  Appropriate orders placed.  Prathik Aman was informed that the remainder of the evaluation will be completed by another provider, this initial triage assessment does not replace that evaluation, and the importance of remaining in the ED until their evaluation is complete.  Alcohol abuse with concern for recurrent pancreatitis   Glynn Octave, MD 05/17/22 [phone]

## 2023-04-14 IMAGING — CT CT ABD-PELV W/ CM
2 of 5 series · 15 of 46 positions shown, 17 images · IV contrast (omnipaque)
Comparison: CT 09/29/2020

CLINICAL DATA: Epigastric pain

EXAM:
CT ABDOMEN AND PELVIS WITH CONTRAST
TECHNIQUE: Multidetector CT imaging of the abdomen and pelvis was performed
using the standard protocol following bolus administration of
intravenous contrast.
CONTRAST:  80mL OMNIPAQUE IOHEXOL 350 MG/ML SOLN

[Series 2: axial st · axial · 0.68mm/px · z∈[+1028,+1393]mm · 12 of 87 slices shown, 14 images]
[im 7/87  soft-tissue]
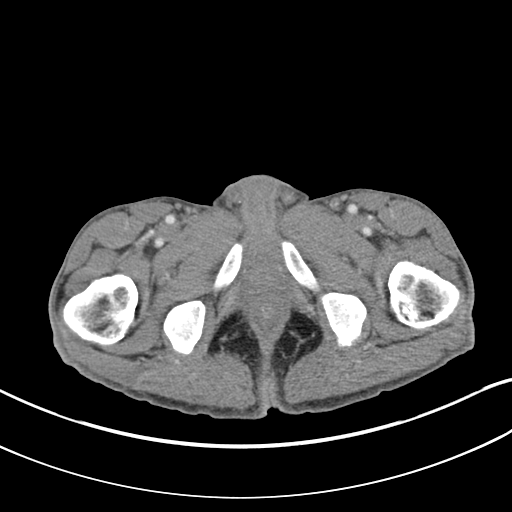
[im 7/87  bone]
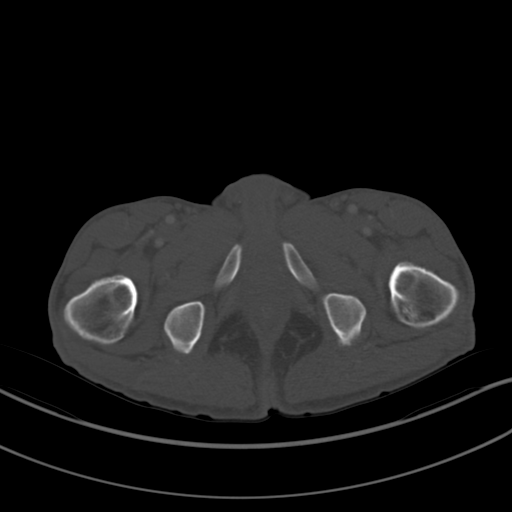
[im 13/87  soft-tissue]
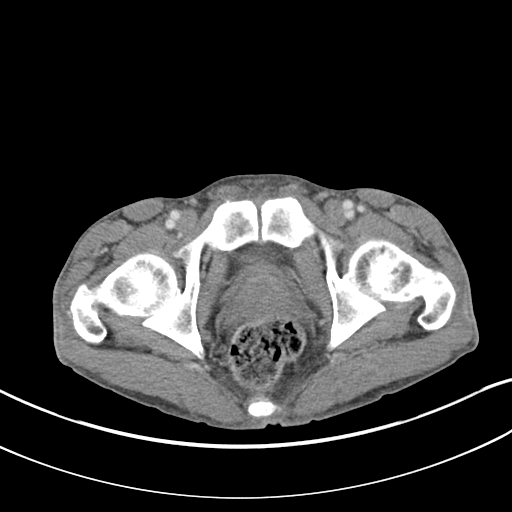
[im 19/87  soft-tissue]
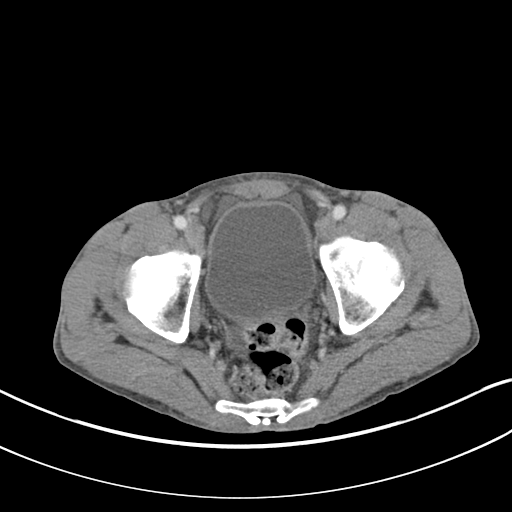
[im 25/87  soft-tissue]
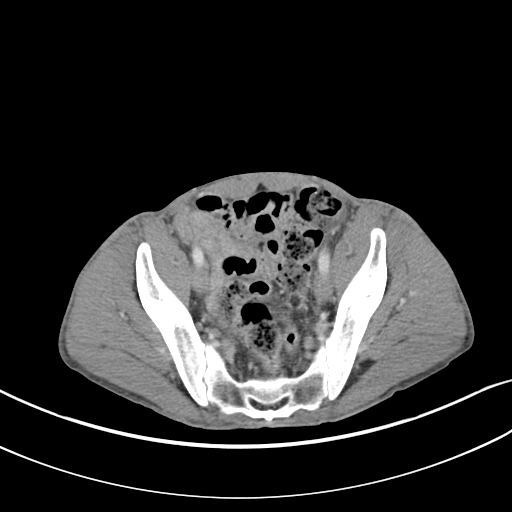
[im 31/87  soft-tissue]
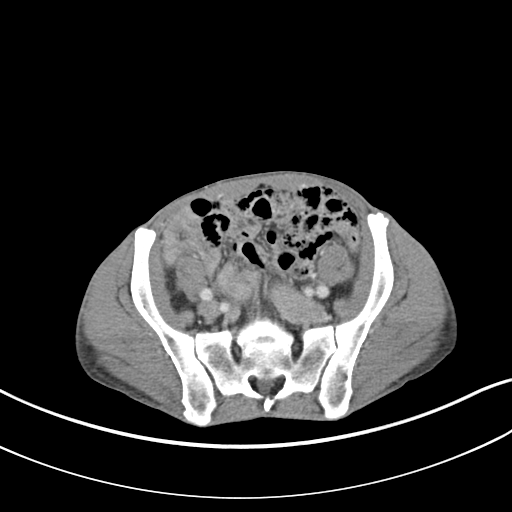
[im 37/87  soft-tissue]
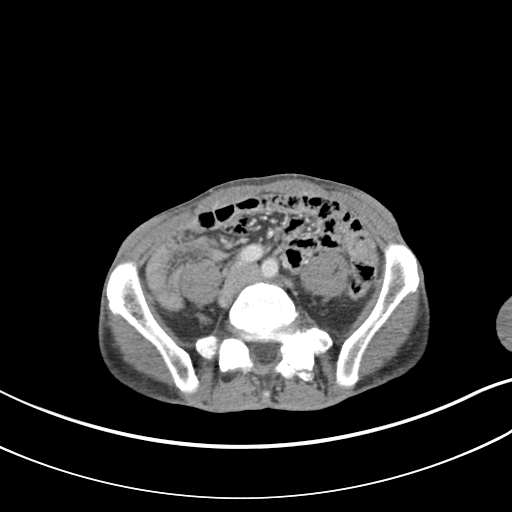
[im 50/87  soft-tissue]
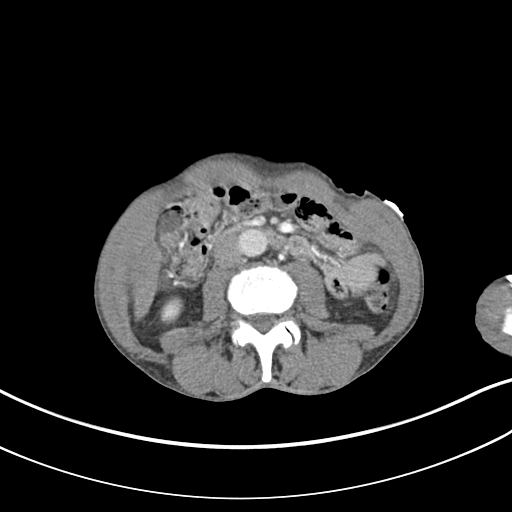
[im 56/87  soft-tissue]
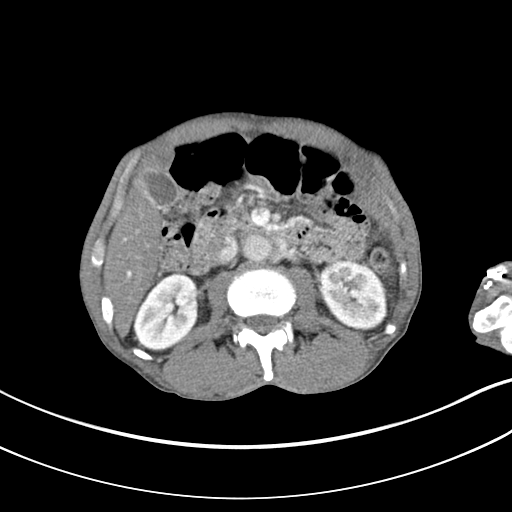
[im 62/87  soft-tissue]
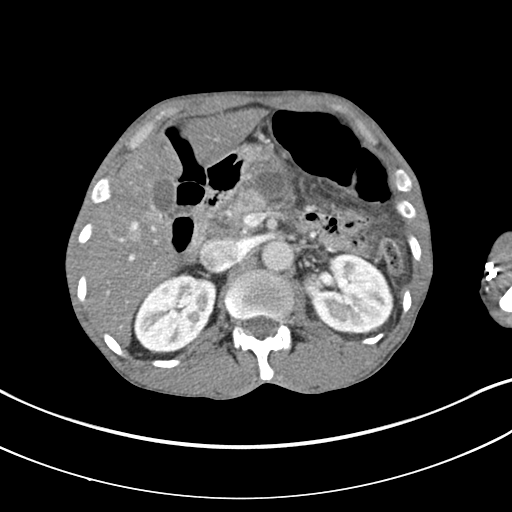
[im 62/87  bone]
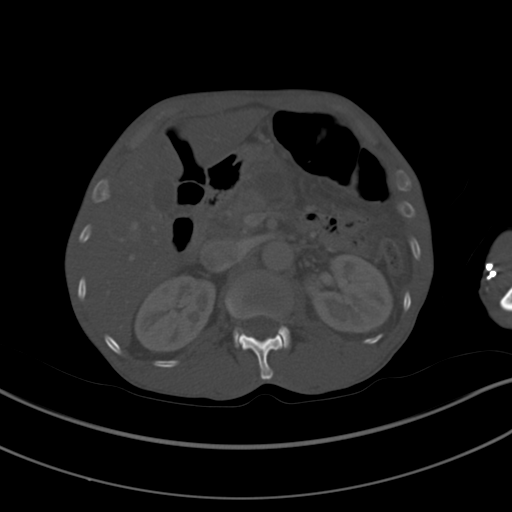
[im 68/87  soft-tissue]
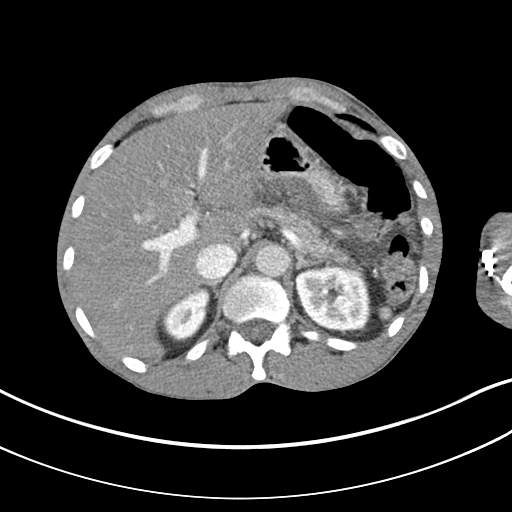
[im 74/87  soft-tissue]
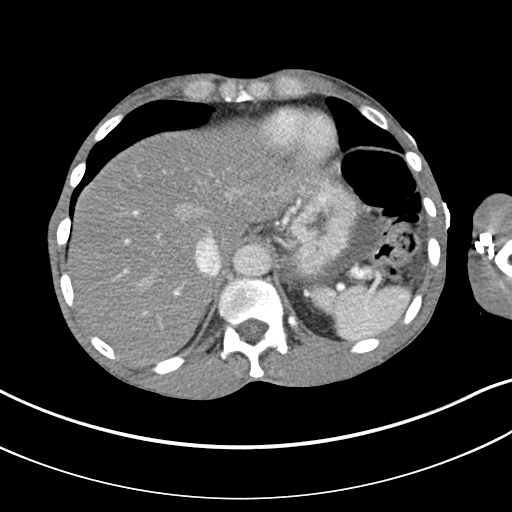
[im 80/87  soft-tissue]
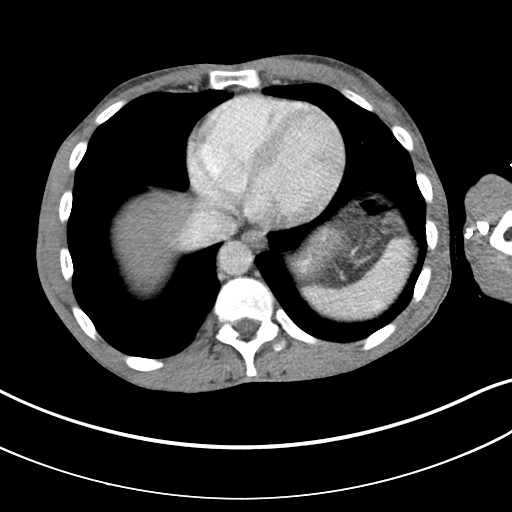

[Series 5: coronal st · coronal · 0.61mm/px · 3 of 134 slices shown]
[im 45/134  soft-tissue]
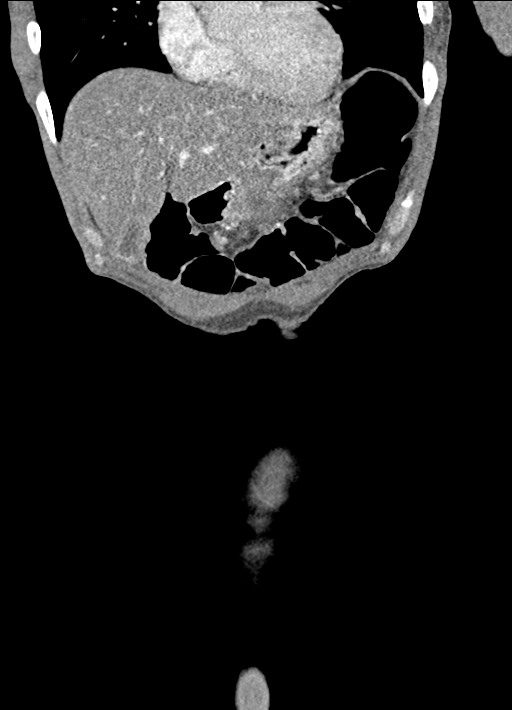
[im 60/134  soft-tissue]
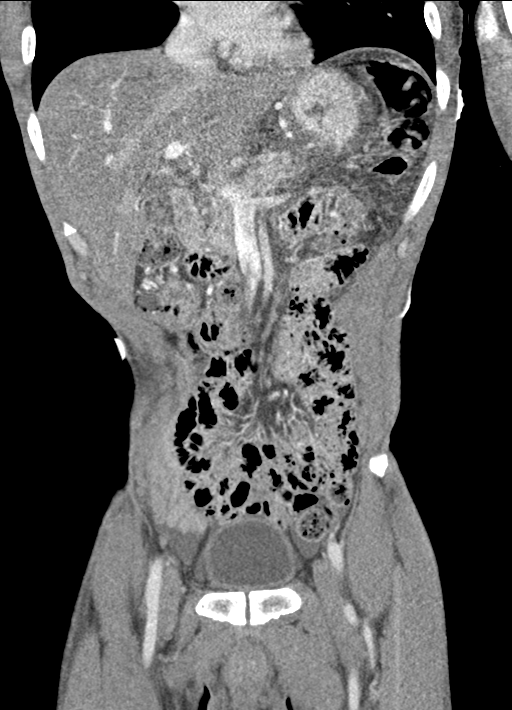
[im 74/134  soft-tissue]
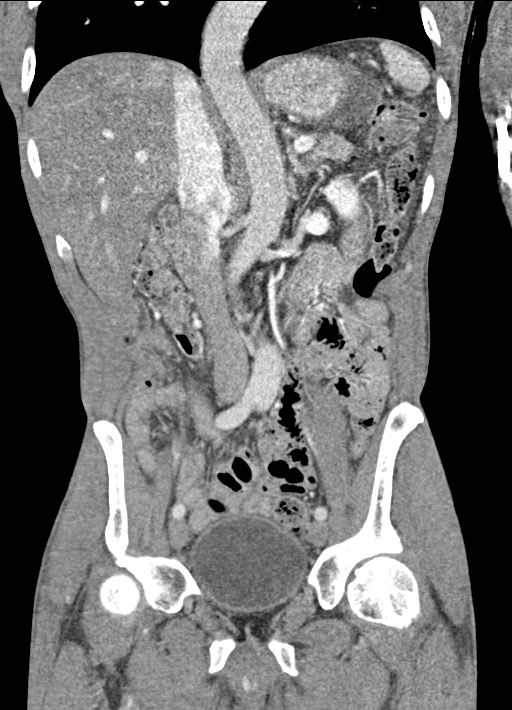

[15 of 46 positions shown; findings below may reference images not displayed]

FINDINGS: Lower chest: Lung bases demonstrate no acute consolidation or
effusion. Normal cardiac size.

Hepatobiliary: Small gallstones. Hepatic steatosis. No biliary
dilatation

Pancreas: Generalized stranding and fluid within the mesentery
consistent with pancreatitis. No ductal dilatation. Organizing fluid
collection now evident between the stomach and proximal pancreas,
this measures 2.8 by 2.1 cm, series 7 image 16.

Spleen: Previously noted hyperenhancing focus is not as well seen
today

Adrenals/Urinary Tract: Adrenal glands normal. Kidneys show no
hydronephrosis. Subcentimeter hypodensity left kidney too small to
further characterize. The bladder is unremarkable.

Stomach/Bowel: Stomach is nonenlarged. Mild rim enhancing fluid
collection along the greater curvature of the stomach, measures
approximately 4.8 by 1.3 cm by 3.1 cm and appears slightly
decreased. No dilated small bowel. No acute bowel wall thickening.

Vascular/Lymphatic: Nonaneurysmal aorta. Mild atherosclerosis. No
suspicious nodes. Portal vein is patent. There is narrowed
appearance of the splenic vein but without occlusion or definitive
thrombus.

Reproductive: Prostate is unremarkable.

Other: Negative for free air. Small amount of free fluid in the
pelvis and abdomen. Generalized mesenteric soft tissue stranding.

Musculoskeletal: No acute or significant osseous findings.
IMPRESSION: 1. Residual edema and fluid anterior to the body and tail of
pancreas consistent with pancreatitis. Slight interval decrease in
size of the rim enhancing fluid collection at greater curvature of
stomach, but interval development of a 2.8 cm organizing fluid
collection between the stomach and body of pancreas, probable
developing pseudocyst.
2. Gallstones
3. Hepatic steatosis
4. Small free fluid in the pelvis and abdomen
